# Patient Record
Sex: Female | Born: 1940 | Race: White | Hispanic: No | Marital: Single | Smoking: Never smoker
Health system: Southern US, Community
[De-identification: ages and names within clinical notes are randomized; demographics above are authoritative.]

## PROBLEM LIST (undated history)

## (undated) DIAGNOSIS — I10 Essential (primary) hypertension: Secondary | ICD-10-CM

## (undated) DIAGNOSIS — Z9289 Personal history of other medical treatment: Secondary | ICD-10-CM

## (undated) DIAGNOSIS — D649 Anemia, unspecified: Secondary | ICD-10-CM

## (undated) DIAGNOSIS — Z8719 Personal history of other diseases of the digestive system: Secondary | ICD-10-CM

## (undated) DIAGNOSIS — R479 Unspecified speech disturbances: Secondary | ICD-10-CM

## (undated) DIAGNOSIS — H269 Unspecified cataract: Secondary | ICD-10-CM

## (undated) DIAGNOSIS — R1310 Dysphagia, unspecified: Secondary | ICD-10-CM

## (undated) DIAGNOSIS — M7050 Other bursitis of knee, unspecified knee: Secondary | ICD-10-CM

## (undated) DIAGNOSIS — T4145XA Adverse effect of unspecified anesthetic, initial encounter: Secondary | ICD-10-CM

## (undated) DIAGNOSIS — T8859XA Other complications of anesthesia, initial encounter: Secondary | ICD-10-CM

## (undated) HISTORY — PX: APPENDECTOMY: SHX54

## (undated) HISTORY — PX: CATARACT EXTRACTION: SUR2

## (undated) HISTORY — PX: TONSILLECTOMY: SUR1361

---

## 1998-08-14 ENCOUNTER — Other Ambulatory Visit: Admission: RE | Admit: 1998-08-14 | Discharge: 1998-08-14 | Payer: Self-pay | Admitting: Endocrinology

## 1998-10-16 ENCOUNTER — Ambulatory Visit (HOSPITAL_COMMUNITY): Admission: RE | Admit: 1998-10-16 | Discharge: 1998-10-16 | Payer: Self-pay | Admitting: Family Medicine

## 1998-10-16 ENCOUNTER — Encounter: Payer: Self-pay | Admitting: Family Medicine

## 2000-07-28 ENCOUNTER — Ambulatory Visit (HOSPITAL_COMMUNITY): Admission: RE | Admit: 2000-07-28 | Discharge: 2000-07-28 | Payer: Self-pay | Admitting: Gastroenterology

## 2001-06-08 ENCOUNTER — Other Ambulatory Visit: Admission: RE | Admit: 2001-06-08 | Discharge: 2001-06-08 | Payer: Self-pay | Admitting: Family Medicine

## 2001-07-05 ENCOUNTER — Encounter: Payer: Self-pay | Admitting: Family Medicine

## 2001-07-05 ENCOUNTER — Encounter: Admission: RE | Admit: 2001-07-05 | Discharge: 2001-07-05 | Payer: Self-pay | Admitting: Family Medicine

## 2002-07-26 ENCOUNTER — Encounter: Admission: RE | Admit: 2002-07-26 | Discharge: 2002-07-26 | Payer: Self-pay | Admitting: Family Medicine

## 2002-07-26 ENCOUNTER — Encounter: Payer: Self-pay | Admitting: Family Medicine

## 2003-07-29 ENCOUNTER — Encounter: Admission: RE | Admit: 2003-07-29 | Discharge: 2003-07-29 | Payer: Self-pay | Admitting: Family Medicine

## 2004-10-02 ENCOUNTER — Encounter: Admission: RE | Admit: 2004-10-02 | Discharge: 2004-10-02 | Payer: Self-pay | Admitting: Family Medicine

## 2005-11-09 ENCOUNTER — Encounter: Admission: RE | Admit: 2005-11-09 | Discharge: 2005-11-09 | Payer: Self-pay | Admitting: Family Medicine

## 2006-07-30 ENCOUNTER — Emergency Department (HOSPITAL_COMMUNITY): Admission: EM | Admit: 2006-07-30 | Discharge: 2006-07-30 | Payer: Self-pay | Admitting: Family Medicine

## 2006-12-07 ENCOUNTER — Encounter: Admission: RE | Admit: 2006-12-07 | Discharge: 2006-12-07 | Payer: Self-pay | Admitting: Family Medicine

## 2007-12-12 ENCOUNTER — Encounter: Admission: RE | Admit: 2007-12-12 | Discharge: 2007-12-12 | Payer: Self-pay | Admitting: Family Medicine

## 2010-09-25 NOTE — Procedures (Signed)
[facility]  Patient:    Lauren [name], Lauren [name]                      MRN: [id number] Proc. Date: 07/28/00 Adm. Date:  [id number] Attending:  Louie Bun CC:         Hadassah Pais. Jeannetta Nap, M.D.   Procedure Report  PROCEDURE:  Esophagogastroduodenoscopy.  ENDOSCOPIST:  Everardo All. Madilyn Fireman, M.D.  INDICATIONS FOR PROCEDURE:  Several-month history of epigastric abdominal pain with failure to respond to antipeptic therapy and a normal abdominal ultrasound.  DESCRIPTION OF PROCEDURE:   The patient was placed in the left lateral decubitus position and placed on the pulse monitor with continuous low-flow oxygen delivered by nasal cannula.  She was sedated with 35 mg IV Demerol and 5 mg IV Versed.  The Olympus video endoscope was advanced under direct vision into the oropharynx and esophagus.  The esophagus was straight and of normal caliber with the squamocolumnar line at 38 cm.  There appeared to be about a 1 cm hiatal hernia distal to the squamocolumnar line.  The stomach was entered, and a small amount of liquid secretion was suctioned from the fundus. Retroflexed view of the cardia was unremarkable.  The fundus body, antrum, and pylorus all appeared normal.  The duodenum was entered and both bulb and second portion were well inspected and appeared to be within normal limits. The endoscope was then withdrawn back into the stomach and CLOtest obtained. The scope was then withdrawn, and the patient  returned to the recovery room in stable condition.  She tolerated the procedure well, and there were no immediate complications.  IMPRESSION: 1. Small hiatal hernia. 2. Otherwise essentially normal esophagogastroduodenoscopy.  PLAN:  Await CLOtest and consider treatment for eradication of Helicobacter if positive.  Will also obtain amylase and hepatic profile.  If all else unrevealing, will consider a trial of an antispasmodic agent. DD:  07/28/00 TD:  07/28/00 Job:  [id number]

## 2014-04-01 ENCOUNTER — Encounter (HOSPITAL_COMMUNITY): Payer: Self-pay | Admitting: Emergency Medicine

## 2014-04-01 ENCOUNTER — Observation Stay (HOSPITAL_COMMUNITY)
Admission: EM | Admit: 2014-04-01 | Discharge: 2014-04-03 | Disposition: A | Discharge status: Home / Self Care | Payer: Medicare Other | Attending: Internal Medicine | Admitting: Internal Medicine

## 2014-04-01 ENCOUNTER — Observation Stay (HOSPITAL_COMMUNITY): Payer: Medicare Other

## 2014-04-01 DIAGNOSIS — F70 Mild intellectual disabilities: Secondary | ICD-10-CM | POA: Insufficient documentation

## 2014-04-01 DIAGNOSIS — K449 Diaphragmatic hernia without obstruction or gangrene: Secondary | ICD-10-CM | POA: Insufficient documentation

## 2014-04-01 DIAGNOSIS — I10 Essential (primary) hypertension: Secondary | ICD-10-CM | POA: Diagnosis present

## 2014-04-01 DIAGNOSIS — F809 Developmental disorder of speech and language, unspecified: Secondary | ICD-10-CM | POA: Diagnosis not present

## 2014-04-01 DIAGNOSIS — K8020 Calculus of gallbladder without cholecystitis without obstruction: Secondary | ICD-10-CM | POA: Insufficient documentation

## 2014-04-01 DIAGNOSIS — D509 Iron deficiency anemia, unspecified: Principal | ICD-10-CM

## 2014-04-01 DIAGNOSIS — E538 Deficiency of other specified B group vitamins: Secondary | ICD-10-CM | POA: Clinically undetermined

## 2014-04-01 DIAGNOSIS — R109 Unspecified abdominal pain: Secondary | ICD-10-CM | POA: Insufficient documentation

## 2014-04-01 DIAGNOSIS — R531 Weakness: Secondary | ICD-10-CM | POA: Insufficient documentation

## 2014-04-01 DIAGNOSIS — R42 Dizziness and giddiness: Secondary | ICD-10-CM | POA: Diagnosis not present

## 2014-04-01 DIAGNOSIS — E785 Hyperlipidemia, unspecified: Secondary | ICD-10-CM | POA: Diagnosis present

## 2014-04-01 DIAGNOSIS — Z7982 Long term (current) use of aspirin: Secondary | ICD-10-CM | POA: Insufficient documentation

## 2014-04-01 DIAGNOSIS — D649 Anemia, unspecified: Secondary | ICD-10-CM

## 2014-04-01 DIAGNOSIS — R1084 Generalized abdominal pain: Secondary | ICD-10-CM | POA: Insufficient documentation

## 2014-04-01 DIAGNOSIS — R0600 Dyspnea, unspecified: Secondary | ICD-10-CM | POA: Insufficient documentation

## 2014-04-01 HISTORY — DX: Essential (primary) hypertension: I10

## 2014-04-01 HISTORY — DX: Anemia, unspecified: D64.9

## 2014-04-01 LAB — CBC
HEMATOCRIT: 22.9 % — AB (ref 36.0–46.0)
Hemoglobin: 5.9 g/dL — CL (ref 12.0–15.0)
MCH: 16.6 pg — AB (ref 26.0–34.0)
MCHC: 25.8 g/dL — ABNORMAL LOW (ref 30.0–36.0)
MCV: 64.3 fL — AB (ref 78.0–100.0)
PLATELETS: 478 10*3/uL — AB (ref 150–400)
RBC: 3.56 MIL/uL — AB (ref 3.87–5.11)
RDW: 17.4 % — ABNORMAL HIGH (ref 11.5–15.5)
WBC: 5.4 10*3/uL (ref 4.0–10.5)

## 2014-04-01 LAB — RETICULOCYTES
RBC.: 3.24 MIL/uL — ABNORMAL LOW (ref 3.87–5.11)
Retic Count, Absolute: 55.1 10*3/uL (ref 19.0–186.0)
Retic Ct Pct: 1.7 % (ref 0.4–3.1)

## 2014-04-01 LAB — COMPREHENSIVE METABOLIC PANEL
ALT: 6 U/L (ref 0–35)
ANION GAP: 14 (ref 5–15)
AST: 14 U/L (ref 0–37)
Albumin: 3.7 g/dL (ref 3.5–5.2)
Alkaline Phosphatase: 66 U/L (ref 39–117)
BUN: 14 mg/dL (ref 6–23)
CALCIUM: 9.5 mg/dL (ref 8.4–10.5)
CO2: 25 meq/L (ref 19–32)
CREATININE: 0.57 mg/dL (ref 0.50–1.10)
Chloride: 98 mEq/L (ref 96–112)
GLUCOSE: 120 mg/dL — AB (ref 70–99)
Potassium: 4 mEq/L (ref 3.7–5.3)
Sodium: 137 mEq/L (ref 137–147)
Total Bilirubin: 0.9 mg/dL (ref 0.3–1.2)
Total Protein: 7.4 g/dL (ref 6.0–8.3)

## 2014-04-01 LAB — TSH: TSH: 1.2 u[IU]/mL (ref 0.350–4.500)

## 2014-04-01 LAB — PREPARE RBC (CROSSMATCH)

## 2014-04-01 LAB — PROTIME-INR
INR: 1.17 (ref 0.00–1.49)
PROTHROMBIN TIME: 15.1 s (ref 11.6–15.2)

## 2014-04-01 LAB — ABO/RH: ABO/RH(D): A POS

## 2014-04-01 LAB — PRO B NATRIURETIC PEPTIDE: PRO B NATRI PEPTIDE: 803.4 pg/mL — AB (ref 0–125)

## 2014-04-01 MED ORDER — PANTOPRAZOLE SODIUM 40 MG PO TBEC
40.0000 mg | DELAYED_RELEASE_TABLET | Freq: Every day | ORAL | Status: DC
  Administered 2014-04-01 – 2014-04-03 (×3): 40 mg via ORAL
  Filled 2014-04-01 (×3): qty 1

## 2014-04-01 MED ORDER — ACETAMINOPHEN 500 MG PO TABS: 500.0000 mg | ORAL_TABLET | Freq: Four times a day (QID) | ORAL | Status: DC | PRN

## 2014-04-01 MED ORDER — PRAVASTATIN SODIUM 20 MG PO TABS
20.0000 mg | ORAL_TABLET | Freq: Every day | ORAL | Status: DC
  Administered 2014-04-02: 20 mg via ORAL
  Filled 2014-04-01 (×2): qty 1

## 2014-04-01 MED ORDER — SODIUM CHLORIDE 0.9 % IV SOLN: 10.0000 mL/h | Freq: Once | INTRAVENOUS | Status: AC

## 2014-04-01 MED ORDER — VITAMIN D (ERGOCALCIFEROL) 1.25 MG (50000 UNIT) PO CAPS
50000.0000 [IU] | ORAL_CAPSULE | ORAL | Status: DC
  Administered 2014-04-02: 50000 [IU] via ORAL
  Filled 2014-04-01 (×2): qty 1

## 2014-04-01 MED ORDER — SODIUM CHLORIDE 0.9 % IV SOLN
Freq: Once | INTRAVENOUS | Status: AC
  Administered 2014-04-01: 23:00:00 via INTRAVENOUS

## 2014-04-01 MED ORDER — INFLUENZA VAC SPLIT QUAD 0.5 ML IM SUSY
0.5000 mL | PREFILLED_SYRINGE | INTRAMUSCULAR | Status: AC
  Administered 2014-04-02: 0.5 mL via INTRAMUSCULAR
  Filled 2014-04-01 (×2): qty 0.5

## 2014-04-01 MED ORDER — IOHEXOL 300 MG/ML  SOLN
100.0000 mL | Freq: Once | INTRAMUSCULAR | Status: AC | PRN
  Administered 2014-04-01: 100 mL via INTRAVENOUS

## 2014-04-01 MED ORDER — ALENDRONATE SODIUM 35 MG PO TABS: 35.0000 mg | ORAL_TABLET | ORAL | Status: DC

## 2014-04-01 NOTE — ED Notes (Addendum)
Pt states no problem. She went to doctors office and had blood work done. Doctor told her to come here to get blood transfused d/t low H&H. Pt pale, skin yellowish tint, weak. Vitals WNL, pt assisted to restroom at this time.

## 2014-04-01 NOTE — ED Provider Notes (Signed)
Medical screening examination/treatment/procedure(s) were conducted as a shared visit with non-physician practitioner(s) and myself.  I personally evaluated the patient during the encounter.   EKG Interpretation None     Patient here from physician's office with hemoglobin of 5.7. Denies any history of blood loss. Will order blood transfusion and admitted to the medicine service  Toy BakerAnthony T Denya Buckingham, MD 04/01/14 1900

## 2014-04-01 NOTE — Plan of Care (Signed)
Problem: Phase I Progression Outcomes Goal: Voiding-avoid urinary catheter unless indicated Outcome: Completed/Met Date Met:  04/01/14

## 2014-04-01 NOTE — H&P (Signed)
Hospitalist Admission History and Physical  Patient name: Lauren [name] Medical record number: [id number] Date of birth: 1940/11/23 Age: 73 y.o. Gender: female  Primary Care Provider: No primary care provider on file.  Chief Complaint: anemia  History of Present Illness:This is a 73 y.o. year old female with significant past medical history of mild MR, HTN  presenting with anemia. 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 Friend states that pt was hospitalized for similar sxs earlier [facility]. Had an extensive workup per report with working dx fo ? b12 deficiency. Pt has been getting b12 shots from her PCP. Friend noticed pallor earlier today. [facility] who directed pt to ER for further evaluation. Pt denies any CP, nausea. Has had some exertional dyspnea per report. Denies any black/tarry stools/GIB. Has had hx/o ? Stomach ulcers in the past. Not aware of most recent EGD. Not on anticoabulation.  Presented to ER hemodynamically stable. Afebrile. Hgb 5.9. MCV 64. Cr 0.57. Hemoccult pending. No purpura/petechiae   Pt does have fair amount of abd TTP on exam.   Assessment and Plan: Lauren [name] is a 73 y.o. year old female presenting with anemia    Active Problems:   Anemia   1- Anemia  -microcytic distribution on MCV  -recurrent issue  -transfuse PRBCs  -hemoccult pending  -follow Hand H -check anemia panel, TSH  - check CT abd and pelvis given abd pain (mild to moderate) -consider H-O consult as clinically indicated   2- HTN  -BP stable  -cont home regimen  -follow BPs w/ transfusion   3- Mild MR  -mild speech/development delay at baseline per associate -pt currently at baseline  -follow    FEN/GI: heart healthy diet. PPI Prophylaxis: SCDs  Disposition: pending further evaluation  Code Status:Full Code    Patient Active Problem List    Diagnosis Date Noted  . Anemia 04/01/2014   Past Medical History: Past Medical History  Diagnosis Date  . Anemia   . Hypertension     Past Surgical History: History reviewed. No pertinent past surgical history.  Social History: History   Social History  . Marital Status: Single    Spouse Name: N/A    Number of Children: N/A  . Years of Education: N/A   Social History Main Topics  . Smoking status: Never Smoker   . Smokeless tobacco: None  . Alcohol Use: No  . Drug Use: No  . Sexual Activity: None   Other Topics Concern  . None   Social History Narrative  . None    Family History: History reviewed. No pertinent family history.  Allergies: No Known Allergies  Current Facility-Administered Medications  Medication Dose Route Frequency Provider Last Rate Last Dose  . 0.9 %  sodium chloride infusion  10 mL/hr Intravenous Once Christopher W Lawyer, PA-C      . 0.9 %  sodium chloride infusion   Intravenous Once Doree AlbeeSteven Jamahl Lemmons, MD       Current Outpatient Prescriptions  Medication Sig Dispense Refill  . acetaminophen (TYLENOL) 500 MG tablet Take 500 mg by mouth every 6 (six) hours as needed for moderate pain.    Marland Kitchen. alendronate (FOSAMAX) 35 MG tablet Take 35 mg by mouth every 7 (seven) days. On Wednesday    . aspirin 81 MG tablet Take 81 mg by mouth daily.    Marland Kitchen. atenolol (TENORMIN) 25 MG tablet  Take 25 mg by mouth daily.    . clonazePAM (KLONOPIN) 0.5 MG tablet Take 0.25-0.5 mg by mouth at bedtime as needed for anxiety.    . ergocalciferol (VITAMIN D2) 50000 UNITS capsule Take 50,000 Units by mouth every 14 (fourteen) days.    Marland Kitchen. lovastatin (MEVACOR) 20 MG tablet Take 20 mg by mouth daily with supper.    . ranitidine (ZANTAC) 150 MG capsule Take 150 mg by mouth every evening.    . cyanocobalamin (,VITAMIN B-12,) 1000 MCG/ML injection Inject 1,000 mcg into the muscle every 30 (thirty) days.     Review Of Systems: 12 point ROS negative except as noted above in  HPI.  Physical Exam: Filed Vitals:   04/01/14 1830  BP: 143/45  Pulse: 73  Temp:   Resp: 23    General: cooperative HEENT: PERRLA and extra ocular movement intact Heart: S1, S2 normal, no murmur, rub or gallop, regular rate and rhythm Lungs: clear to auscultation, no wheezes or rales and unlabored breathing Abdomen: + bowel sounds, mild abd TTP diffusely  Extremities: 2+ peripheral pulses, trace edema  Skin:no rashes, + diffuse pallor  Neurology: normal without focal findings  Labs and Imaging: Lab Results  Component Value Date/Time   NA 137 04/01/2014 06:05 PM   K 4.0 04/01/2014 06:05 PM   CL 98 04/01/2014 06:05 PM   CO2 25 04/01/2014 06:05 PM   BUN 14 04/01/2014 06:05 PM   CREATININE 0.57 04/01/2014 06:05 PM   GLUCOSE 120* 04/01/2014 06:05 PM   Lab Results  Component Value Date   WBC 5.4 04/01/2014   HGB 5.9* 04/01/2014   HCT 22.9* 04/01/2014   MCV 64.3* 04/01/2014   PLT 478* 04/01/2014    No results found.         Doree AlbeeSteven Oliana Gowens MD  Pager: [phone]

## 2014-04-01 NOTE — ED Notes (Signed)
Pt sent by PCP for Hgb 5.7. Hx of the same in May 2015, thought it was related to B12 deficiency. Pt has been getting B12 injections but problem happened again today. Denies rectal bleeding, colonoscopy negative during last episode of anemia. Pt states she feel normal. Pt pale.

## 2014-04-02 DIAGNOSIS — I10 Essential (primary) hypertension: Secondary | ICD-10-CM

## 2014-04-02 DIAGNOSIS — D509 Iron deficiency anemia, unspecified: Secondary | ICD-10-CM | POA: Diagnosis not present

## 2014-04-02 DIAGNOSIS — E538 Deficiency of other specified B group vitamins: Secondary | ICD-10-CM | POA: Clinically undetermined

## 2014-04-02 DIAGNOSIS — E785 Hyperlipidemia, unspecified: Secondary | ICD-10-CM | POA: Diagnosis present

## 2014-04-02 LAB — CBC WITH DIFFERENTIAL/PLATELET
BASOS PCT: 0 % (ref 0–1)
Basophils Absolute: 0 10*3/uL (ref 0.0–0.1)
EOS PCT: 0 % (ref 0–5)
Eosinophils Absolute: 0 10*3/uL (ref 0.0–0.7)
HEMATOCRIT: 32.4 % — AB (ref 36.0–46.0)
HEMOGLOBIN: 9.9 g/dL — AB (ref 12.0–15.0)
LYMPHS ABS: 0.5 10*3/uL — AB (ref 0.7–4.0)
LYMPHS PCT: 11 % — AB (ref 12–46)
MCH: 21.9 pg — ABNORMAL LOW (ref 26.0–34.0)
MCHC: 30.6 g/dL (ref 30.0–36.0)
MCV: 71.7 fL — ABNORMAL LOW (ref 78.0–100.0)
MONOS PCT: 17 % — AB (ref 3–12)
Monocytes Absolute: 0.8 10*3/uL (ref 0.1–1.0)
NEUTROS ABS: 3.3 10*3/uL (ref 1.7–7.7)
Neutrophils Relative %: 72 % (ref 43–77)
Platelets: 365 10*3/uL (ref 150–400)
RBC: 4.52 MIL/uL (ref 3.87–5.11)
RDW: 22 % — ABNORMAL HIGH (ref 11.5–15.5)
WBC: 4.6 10*3/uL (ref 4.0–10.5)

## 2014-04-02 LAB — COMPREHENSIVE METABOLIC PANEL
ALBUMIN: 3.1 g/dL — AB (ref 3.5–5.2)
ALK PHOS: 60 U/L (ref 39–117)
ALT: 6 U/L (ref 0–35)
ANION GAP: 17 — AB (ref 5–15)
AST: 14 U/L (ref 0–37)
BUN: 8 mg/dL (ref 6–23)
CALCIUM: 8.9 mg/dL (ref 8.4–10.5)
CO2: 21 mEq/L (ref 19–32)
Chloride: 100 mEq/L (ref 96–112)
Creatinine, Ser: 0.59 mg/dL (ref 0.50–1.10)
GFR calc non Af Amer: >90 mL/min (ref >90)
Glucose, Bld: 131 mg/dL — ABNORMAL HIGH (ref 70–99)
POTASSIUM: 3.5 meq/L — AB (ref 3.7–5.3)
SODIUM: 138 meq/L (ref 137–147)
TOTAL PROTEIN: 6.3 g/dL (ref 6.0–8.3)
Total Bilirubin: 1.9 mg/dL — ABNORMAL HIGH (ref 0.3–1.2)

## 2014-04-02 LAB — IRON AND TIBC
IRON: 10 ug/dL — AB (ref 42–135)
Saturation Ratios: 3 % — ABNORMAL LOW (ref 20–55)
TIBC: 376 ug/dL (ref 250–470)
UIBC: 366 ug/dL (ref 125–400)

## 2014-04-02 LAB — VITAMIN B12: VITAMIN B 12: 401 pg/mL (ref 211–911)

## 2014-04-02 MED ORDER — ATENOLOL 25 MG PO TABS
25.0000 mg | ORAL_TABLET | Freq: Every day | ORAL | Status: DC
  Administered 2014-04-02 – 2014-04-03 (×2): 25 mg via ORAL
  Filled 2014-04-02 (×3): qty 1

## 2014-04-02 MED ORDER — CLONAZEPAM 0.5 MG PO TABS: 0.2500 mg | ORAL_TABLET | Freq: Every evening | ORAL | Status: DC | PRN

## 2014-04-02 MED ORDER — FAMOTIDINE 20 MG PO TABS
20.0000 mg | ORAL_TABLET | Freq: Every day | ORAL | Status: DC
  Administered 2014-04-02: 20 mg via ORAL
  Filled 2014-04-02 (×2): qty 1

## 2014-04-02 NOTE — Progress Notes (Signed)
UR completed 

## 2014-04-02 NOTE — Progress Notes (Signed)
[facility] PROGRESS NOTE  Lauren RocherMyrtle [name] ZOX:[id number] DOB: [birth year] DOA: 04/01/2014 PCP: Kaleen MaskELKINS,WILSON OLIVER, MD   Brief narrative 73 y.o. year old female with significant past medical history of mild MR, HTN sent from PCP office with anemia. 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 Friend states that pt was hospitalized for [facility] sxs [facility]. Had an extensive workup per report with working dx fo ? b12 deficiency. Pt has been getting b12 shots from her PCP. Friend noticed that patient was found to be dyspnic and took her to PCP office. .Denies any black/tarry stools/GIB. Denies use of NSAIDs. Patient  hemodynamically stable in ED . Afebrile. Hgb 5.9. MCV 64. Cr 0.57.  Admitted to medial floor for transfusion and  w/up   Assessment/Plan: Symptomatic anemia As confirmed from PCP office, Hb previously of 5.5 in April and was sent to [facility] where she was transfused 3 u PRBC ( d/c summary reported anemia of unknown origin). Prior to that her hb was 11.9 in 02/2013. She has not been seen in the office after that and no blood work was done until yesterday ( ws 5.7 yesterday) Colonoscopy done in 2010 by Dr Matthias HughsBuccini ( no results in the system). EGD done in 2012 by Dr Madilyn FiremanHayes showed small hiatal hernia.  hb improved to 9.9 with 2 U PRBC. Denies use of NSAIDs. Denies bleeding per rectum or hematemesis. Low MCV noted. -TSH normal. check iron panel ( will reorder as sent on admission still not resulted), stool for occult blood,  B12 and folate. Will ask GI for evaluation.   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 Lives alone. PT  eval   Consultants:  [facility]  Procedures:  none  Antibiotics:  none  HPI/Subjective: Seen and examined. Dyspnea and fatigue improved with transfusion  Objective: Filed Vitals:   04/02/14 0845  BP: 135/55  Pulse: 75  Temp: 98.3 F (36.8 C)  Resp: 18    Intake/Output Summary (Last 24 hours) at 04/02/14 1446 Last data filed at 04/02/14 1300  Gross per 24 hour  Intake   2262 ml  Output    500 ml  Net   1762 ml   Filed Weights   04/01/14 2150 04/02/14 0500  Weight: 46.6 kg (102 lb 11.8 oz) 46.4 kg (102 lb 4.7 oz)    Exam:   General:  Elderly female in NAD  HEENT: pallor+, Moist mucosa,   Cardiovascular: normal S1&S2, no murmurs  Respiratory: clear b/l  Abdomen: soft, NT, ND, BS+  Ext: warm, no edema  CNS: alert and oriented  Data Reviewed: Basic Metabolic Panel:  Recent Labs Lab 04/01/14 1805 04/02/14 1040  NA 137 138  K 4.0 3.5*  CL 98 100  CO2 25 21  GLUCOSE 120* 131*  BUN 14 8  CREATININE 0.57 0.59  CALCIUM 9.5 8.9   Liver Function Tests:  Recent Labs Lab 04/01/14 1805 04/02/14 1040  AST 14 14  ALT 6 6  ALKPHOS 66 60  BILITOT 0.9 1.9*  PROT 7.4 6.3  ALBUMIN 3.7 3.1*   No results for input(s): LIPASE, AMYLASE in the last 168 hours. No results for input(s): AMMONIA  in the last 168 hours. CBC:  Recent Labs Lab 04/01/14 1805 04/02/14 1040  WBC 5.4 4.6  NEUTROABS  --  3.3  HGB 5.9* 9.9*  HCT 22.9* 32.4*  MCV 64.3* 71.7*  PLT 478* 365   Cardiac Enzymes: No results for input(s): CKTOTAL, CKMB, CKMBINDEX, TROPONINI in the last 168 hours. BNP (last 3 results)  Recent Labs  04/01/14 2030  PROBNP 803.4*   CBG: No results for input(s): GLUCAP in the last 168 hours.  No results found for this or any previous visit (from the past 240 hour(s)).   Studies: Ct Abdomen Pelvis W Contrast  04/01/2014   CLINICAL DATA:  Generalized abdominal pain. Lead loss. Anemia. Weakness.  EXAM: CT ABDOMEN AND PELVIS WITH CONTRAST   TECHNIQUE: Multidetector CT imaging of the abdomen and pelvis was performed using the standard protocol following bolus administration of intravenous contrast.  CONTRAST:  100mL OMNIPAQUE IOHEXOL 300 MG/ML  SOLN  COMPARISON:  None.  FINDINGS: There is a large hiatal hernia. Heart size is normal. Lung bases are clear. There stones in the gallbladder but the gallbladder is not distended. No dilated bile ducts. Liver parenchyma, spleen, pancreas, adrenal glands, and kidneys are normal except for 4 mm cyst in the upper pole of the left kidney.  The uterus and ovaries and bladder are normal. There are few diverticula in the sigmoid colon. No free air or free fluid. No acute osseous abnormality. Multilevel degenerative disc disease in the lumbar spine. Old compression deformity of L1.  IMPRESSION: No acute abnormality. Diverticulosis of the distal colon. Large hiatal hernia. Cholelithiasis.   Electronically Signed   By: Geanie CooleyJim  Maxwell M.D.   On: 04/01/2014 21:53    Scheduled Meds: . pantoprazole  40 mg Oral Daily  . pravastatin  20 mg Oral q1800  . Vitamin D (Ergocalciferol)  50,000 Units Oral Q14 Days   Continuous Infusions:      Time spent: 25 MINUTES    [name]  [facility] Pager [phone]. If 7PM-7AM, please contact night-coverage at [email or web address], password [email or web address] 04/02/2014, 2:46 PM  LOS: 1 day

## 2014-04-03 DIAGNOSIS — D509 Iron deficiency anemia, unspecified: Secondary | ICD-10-CM | POA: Diagnosis not present

## 2014-04-03 DIAGNOSIS — R1084 Generalized abdominal pain: Secondary | ICD-10-CM

## 2014-04-03 DIAGNOSIS — R109 Unspecified abdominal pain: Secondary | ICD-10-CM | POA: Insufficient documentation

## 2014-04-03 LAB — CBC WITH DIFFERENTIAL/PLATELET
BASOS ABS: 0.1 10*3/uL (ref 0.0–0.1)
Basophils Relative: 1 % (ref 0–1)
Eosinophils Absolute: 0.2 10*3/uL (ref 0.0–0.7)
Eosinophils Relative: 4 % (ref 0–5)
HCT: 31.9 % — ABNORMAL LOW (ref 36.0–46.0)
Hemoglobin: 9.6 g/dL — ABNORMAL LOW (ref 12.0–15.0)
LYMPHS ABS: 1 10*3/uL (ref 0.7–4.0)
Lymphocytes Relative: 17 % (ref 12–46)
MCH: 21.8 pg — AB (ref 26.0–34.0)
MCHC: 30.1 g/dL (ref 30.0–36.0)
MCV: 72.5 fL — AB (ref 78.0–100.0)
Monocytes Absolute: 0.8 10*3/uL (ref 0.1–1.0)
Monocytes Relative: 13 % — ABNORMAL HIGH (ref 3–12)
Neutro Abs: 4 10*3/uL (ref 1.7–7.7)
Neutrophils Relative %: 65 % (ref 43–77)
Platelets: 365 10*3/uL (ref 150–400)
RBC: 4.4 MIL/uL (ref 3.87–5.11)
RDW: 22.2 % — AB (ref 11.5–15.5)
WBC: 6.1 10*3/uL (ref 4.0–10.5)

## 2014-04-03 LAB — TYPE AND SCREEN
ABO/RH(D): A POS
ANTIBODY SCREEN: NEGATIVE
UNIT DIVISION: 0
Unit division: 0
Unit division: 0

## 2014-04-03 LAB — COMPREHENSIVE METABOLIC PANEL
ALBUMIN: 2.9 g/dL — AB (ref 3.5–5.2)
ALT: 5 U/L (ref 0–35)
AST: 18 U/L (ref 0–37)
Alkaline Phosphatase: 54 U/L (ref 39–117)
Anion gap: 13 (ref 5–15)
BUN: 17 mg/dL (ref 6–23)
CO2: 24 mEq/L (ref 19–32)
Calcium: 8.7 mg/dL (ref 8.4–10.5)
Chloride: 104 mEq/L (ref 96–112)
Creatinine, Ser: 0.56 mg/dL (ref 0.50–1.10)
GFR calc Af Amer: >90 mL/min (ref >90)
GFR calc non Af Amer: >90 mL/min (ref >90)
Glucose, Bld: 114 mg/dL — ABNORMAL HIGH (ref 70–99)
POTASSIUM: 3.7 meq/L (ref 3.7–5.3)
Sodium: 141 mEq/L (ref 137–147)
Total Bilirubin: 1.4 mg/dL — ABNORMAL HIGH (ref 0.3–1.2)
Total Protein: 6 g/dL (ref 6.0–8.3)

## 2014-04-03 LAB — FOLATE RBC: RBC Folate: 898 ng/mL — ABNORMAL HIGH (ref >280)

## 2014-04-03 MED ORDER — CLONAZEPAM 0.5 MG PO TABS: 0.2500 mg | ORAL_TABLET | Freq: Every evening | ORAL | Status: AC | PRN

## 2014-04-03 NOTE — Discharge Summary (Signed)
Physician Discharge Summary  Lauren RocherMyrtle [name] EAV:[id number] DOB: [birth year] DOA: 04/01/2014  PCP: Kaleen MaskELKINS,WILSON OLIVER, MD  Admit date: 04/01/2014 Discharge date: 04/03/2014  Recommendations for Outpatient Follow-up:  1. Pt will need to follow up with PCP in 2-3 weeks post discharge 2. Please obtain BMP to evaluate electrolytes and kidney function 3. Please also check CBC to evaluate Hg and Hct levels 4. Pt made aware that she needs to call Dr. Donavan BurnetBuccini's office to schedule follow up appointment   Discharge Diagnoses:  Principal Problem:   Microcytic anemia Active Problems:   Anemia   Essential hypertension, benign   Hyperlipidemia   B12 deficiency  Discharge Condition: Stable  Diet recommendation: Heart healthy diet discussed in details   Brief narrative 73 y.o. year old female with significant past medical history of mild MR, HTN sent from PCP office with anemia. Pt's friend and close associate Harriett Sineancy provided majority of history in setting of pt w/ mild MR w/ speech/developmental delay. Friend states that pt was hospitalized for [facility] sxs earlier [facility]. Had an extensive workup per report with working dx fo ? b12 deficiency. Pt has been getting b12 shots from her PCP. Friend noticed that patient was found to be dyspnic and took her to PCP office. .Denies any black/tarry stools/GIB. Denies use of NSAIDs.  Assessment/Plan: Symptomatic anemia As confirmed from PCP office, Hb previously of 5.5 in April and was sent to [facility] where she was transfused 3 u PRBC ( d/c summary reported anemia of unknown origin). Prior to that her hb was 11.9 in 02/2013. She has not been seen in the office after that and no blood work was done until one day PTA (5.7) Colonoscopy done in 2010 by Dr Matthias HughsBuccini ( no results in the system). EGD done in 2012 by Dr Madilyn FiremanHayes showed small hiatal hernia. hb improved to 9.9 with 2 U PRBC. Denies use of NSAIDs. Denies bleeding per  rectum or hematemesis. Low MCV noted. -TSH normal.  - Hg stable this AM and pt wants to go home with outpatient follow up with GI   HTN BP stable home meds  Mild mental retardation stable at baseline  Weakness:  PT to continue at home    DVT prophylaxis: SCD  Diet: regular   Code Status: full  Family Communication: none at bedside Disposition Plan: home today   Consultants:  None  Procedures:  None   Antibiotics:  None   Discharge Exam: Filed Vitals:   04/03/14 0500  BP: 124/58  Pulse:   Temp: 98.9 F (37.2 C)  Resp: 20   Filed Vitals:   04/02/14 0845 04/02/14 1457 04/02/14 2103 04/03/14 0500  BP: 135/55 155/57 131/54 124/58  Pulse: 75 78 73   Temp: 98.3 F (36.8 C) 98.6 F (37 C) 99.4 F (37.4 C) 98.9 F (37.2 C)  TempSrc: Oral Oral Oral Oral  Resp: 18 16 18 20   Height:      Weight:    45.5 kg (100 lb 5 oz)  SpO2: 99% 99% 97% 95%    General: Pt is alert, follows commands appropriately, not in acute distress Cardiovascular: Regular rate and rhythm, no rubs, no gallops Respiratory: Clear to auscultation bilaterally, no wheezing, no crackles, no rhonchi Abdominal: Soft, non tender, non distended, bowel sounds +, no guarding Extremities: no edema, no cyanosis, pulses palpable bilaterally DP and PT Neuro: Grossly nonfocal  Discharge Instructions     Medication List    TAKE these medications  acetaminophen 500 MG tablet  Commonly known as:  TYLENOL  Take 500 mg by mouth every 6 (six) hours as needed for moderate pain.     alendronate 35 MG tablet  Commonly known as:  FOSAMAX  Take 35 mg by mouth every 7 (seven) days. On Wednesday     aspirin 81 MG tablet  Take 81 mg by mouth daily.     atenolol 25 MG tablet  Commonly known as:  TENORMIN  Take 25 mg by mouth daily.     clonazePAM 0.5 MG tablet  Commonly known as:  KLONOPIN  Take 0.5-1 tablets (0.25-0.5 mg total) by mouth at bedtime as needed for anxiety.      cyanocobalamin 1000 MCG/ML injection  Commonly known as:  (VITAMIN B-12)  Inject 1,000 mcg into the muscle every 30 (thirty) days.     ergocalciferol 50000 UNITS capsule  Commonly known as:  VITAMIN D2  Take 50,000 Units by mouth every 14 (fourteen) days.     lovastatin 20 MG tablet  Commonly known as:  MEVACOR  Take 20 mg by mouth daily with supper.     ranitidine 150 MG capsule  Commonly known as:  ZANTAC  Take 150 mg by mouth every evening.           Follow-up Information    Follow up with Kaleen MaskELKINS,WILSON OLIVER, MD.   Specialty:  [facility]   Contact information:   [address] [place] [address] [phone]       Follow up with Barrie FolkHAYES,JOHN C, MD.   Specialty:  Gastroenterology   Contact information:   [address] [place] [address] [phone]       Follow up with Debbora PrestoMAGICK-MYERS, ISKRA, MD.   Specialty:  Internal Medicine   Why:  As needed call my cell phone [phone]   Contact information:   [address] [place] [address] [phone]        The results of significant diagnostics from this hospitalization (including imaging, microbiology, ancillary and laboratory) are listed below for reference.     Microbiology: No results found for this or any previous visit (from the past 240 hour(s)).   Labs: Basic Metabolic Panel:  Recent Labs Lab 04/01/14 1805 04/02/14 1040 04/03/14 0505  NA 137 138 141  K 4.0 3.5* 3.7  CL 98 100 104  CO2 25 21 24   GLUCOSE 120* 131* 114*  BUN 14 8 17   CREATININE 0.57 0.59 0.56  CALCIUM 9.5 8.9 8.7   Liver Function Tests:  Recent Labs Lab 04/01/14 1805 04/02/14 1040 04/03/14 0505  AST 14 14 18   ALT 6 6 5   ALKPHOS 66 60 54  BILITOT 0.9 1.9* 1.4*  PROT 7.4 6.3 6.0  ALBUMIN 3.7 3.1* 2.9*   CBC:  Recent Labs Lab 04/01/14 1805 04/02/14 1040 04/03/14 0505  WBC 5.4 4.6 6.1  NEUTROABS  --  3.3 4.0  HGB 5.9* 9.9* 9.6*  HCT 22.9* 32.4* 31.9*  MCV  64.3* 71.7* 72.5*  PLT 478* 365 365    BNP (last 3 results)  Recent Labs  04/01/14 2030  PROBNP 803.4*   SIGNED: Time coordinating discharge: Over 30 minutes  Debbora PrestoMAGICK-MYERS, ISKRA, MD  [facility] 04/03/2014, 9:18 AM Pager [phone]  If 7PM-7AM, please contact night-coverage [email or web address] Password [email or web address]

## 2014-04-03 NOTE — Plan of Care (Signed)
Problem: Phase I Progression Outcomes Goal: Pain controlled with appropriate interventions Outcome: Completed/Met Date Met:  04/03/14 Goal: OOB as tolerated unless otherwise ordered Outcome: Completed/Met Date Met:  04/03/14  Problem: Phase II Progression Outcomes Goal: IV changed to normal saline lock Outcome: Completed/Met Date Met:  04/03/14

## 2014-04-03 NOTE — Evaluation (Signed)
Physical Therapy Evaluation Patient Details Name: Lauren RocherMyrtle Bonny MRN: [id number] DOB: [birth year] Today's Date: 04/03/2014   History of Present Illness  History of Present Illness:This is a 73 y.o. year old female with significant past medical history of mild MR, HTN  presenting with anemia.  Clinical Impression  **Pt ambulated 150' with RW independently, no LOB. She appears to be at baseline of modified independent with mobility. From PT standpoint she is ready to DC home, no follow up needed. *    Follow Up Recommendations No PT follow up    Equipment Recommendations  None recommended by PT    Recommendations for Other Services       Precautions / Restrictions Precautions Precautions: None Restrictions Weight Bearing Restrictions: No      Mobility  Bed Mobility                  Transfers Overall transfer level: Independent                  Ambulation/Gait Ambulation/Gait assistance: Modified independent (Device/Increase time) Ambulation Distance (Feet): 150 Feet Assistive device: Rolling walker (2 wheeled) Gait Pattern/deviations: WFL(Within Functional Limits)   Gait velocity interpretation: Below normal speed for age/gender    Stairs            Wheelchair Mobility    Modified Rankin (Stroke Patients Only)       Balance Overall balance assessment: Modified Independent                                           Pertinent Vitals/Pain Pain Assessment: No/denies pain    Home Living Family/patient expects to be discharged to:: Private residence Living Arrangements: Alone Available Help at Discharge: Family;Available PRN/intermittently Type of Home: Apartment         Home Equipment: Walker - 4 wheels      Prior Function Level of Independence: Independent with assistive device(s)         Comments: walks with rollator when outside, no AD in apt, cousin drives her to get groceries     Hand Dominance        Extremity/Trunk Assessment   Upper Extremity Assessment: Overall WFL for tasks assessed           Lower Extremity Assessment: Overall WFL for tasks assessed      Cervical / Trunk Assessment: Kyphotic  Communication   Communication: No difficulties  Cognition Arousal/Alertness: Awake/alert Behavior During Therapy: WFL for tasks assessed/performed Overall Cognitive Status: History of cognitive impairments - at baseline                      General Comments      Exercises        Assessment/Plan    PT Assessment Patent does not need any further PT services  PT Diagnosis     PT Problem List    PT Treatment Interventions     PT Goals (Current goals can be found in the Care Plan section) Acute Rehab PT Goals PT Goal Formulation: All assessment and education complete, DC therapy    Frequency     Barriers to discharge        Co-evaluation               End of Session Equipment Utilized During Treatment: Gait belt Activity Tolerance: Patient tolerated treatment well Patient left: in chair;with  call bell/phone within reach Nurse Communication: Mobility status    Functional Assessment Tool Used: clinical judgement Functional Limitation: Mobility: Walking and moving around Mobility: Walking and Moving Around Current Status [phone](G8978): 0 percent impaired, limited or restricted Mobility: Walking and Moving Around Goal Status [phone](G8979): 0 percent impaired, limited or restricted Mobility: Walking and Moving Around Discharge Status [phone](G8980): 0 percent impaired, limited or restricted    Time: 1051-1107 PT Time Calculation (min) (ACUTE ONLY): 16 min   Charges:   PT Evaluation $Initial PT Evaluation Tier I: 1 Procedure PT Treatments $Gait Training: 8-22 mins   PT G Codes:   Functional Assessment Tool Used: clinical judgement Functional Limitation: Mobility: Walking and moving around    LaughlinUhlenberg, Pilgrim's PrideJennifer Kistler 04/03/2014, 11:09 AM [phone]

## 2014-04-03 NOTE — Progress Notes (Deleted)
CARE MANAGEMENT NOTE 04/03/2014  Patient:  Baxter KailMATNABANA,VHUTSHILO DAVID   Account Number:  [id number]  Date Initiated:  04/02/2014  Documentation initiated by:  Ferdinand CavaSCHETTINO,Suriyah Vergara  Subjective/Objective Assessment:   73 yo female admitted with drug overdose, from home with mother and GF     Action/Plan:   discharge planning   Anticipated DC Date:  04/04/2014   Anticipated DC Plan:  HOME/SELF CARE  In-house referral  Clinical Social Worker      DC Associate Professorlanning Services  CM consult  PCP issues      Choice offered to / List presented to:  C-1 Patient           Status of service:  Completed, signed off Medicare Important Message given?   (If response is "NO", the following Medicare IM given date fields will be blank) Date Medicare IM given:   Medicare IM given by:   Date Additional Medicare IM given:   Additional Medicare IM given by:    Discharge Disposition:  HOME/SELF CARE  Per UR Regulation:    If discussed at Long Length of Stay Meetings, dates discussed:    Comments:  04/03/14 Ferdinand CavaAndrea Schettino RN BSN CM [phone] [id number] Provided patient with 5 page resource list for uninsured, also provided a [facility] pamphlet and encouraged patient to establish care with PCP.  04/02/14 Ferdinand CavaAndrea Schettino RN BSN CM [phone] no orders, no case management needs, will continue to follow

## 2014-04-03 NOTE — Discharge Instructions (Signed)
Anemia, Nonspecific Anemia is a condition in which the concentration of red blood cells or hemoglobin in the blood is below normal. Hemoglobin is a substance in red blood cells that carries oxygen to the tissues of the body. Anemia results in not enough oxygen reaching these tissues.  CAUSES  Common causes of anemia include:   Excessive bleeding. Bleeding may be internal or external. This includes excessive bleeding from periods (in women) or from the intestine.   Poor nutrition.   Chronic kidney, thyroid, and liver disease.  Bone marrow disorders that decrease red blood cell production.  Cancer and treatments for cancer.  HIV, AIDS, and their treatments.  Spleen problems that increase red blood cell destruction.  Blood disorders.  Excess destruction of red blood cells due to infection, medicines, and autoimmune disorders. SIGNS AND SYMPTOMS   Minor weakness.   Dizziness.   Headache.  Palpitations.   Shortness of breath, especially with exercise.   Paleness.  Cold sensitivity.  Indigestion.  Nausea.  Difficulty sleeping.  Difficulty concentrating. Symptoms may occur suddenly or they may develop slowly.  DIAGNOSIS  Additional blood tests are often needed. These help your health care provider determine the best treatment. Your health care provider will check your stool for blood and look for other causes of blood loss.  TREATMENT  Treatment varies depending on the cause of the anemia. Treatment can include:   Supplements of iron, vitamin B12, or folic acid.   Hormone medicines.   A blood transfusion. This may be needed if blood loss is severe.   Hospitalization. This may be needed if there is significant continual blood loss.   Dietary changes.  Spleen removal. HOME CARE INSTRUCTIONS Keep all follow-up appointments. It often takes many weeks to correct anemia, and having your health care provider check on your condition and your response to  treatment is very important. SEEK IMMEDIATE MEDICAL CARE IF:   You develop extreme weakness, shortness of breath, or chest pain.   You become dizzy or have trouble concentrating.  You develop heavy vaginal bleeding.   You develop a rash.   You have bloody or black, tarry stools.   You faint.   You vomit up blood.   You vomit repeatedly.   You have abdominal pain.  You have a fever or persistent symptoms for more than 2-3 days.   You have a fever and your symptoms suddenly get worse.   You are dehydrated.  MAKE SURE YOU:  Understand these instructions.  Will watch your condition.  Will get help right away if you are not doing well or get worse. Document Released: 06/03/2004 Document Revised: 12/27/2012 Document Reviewed: 10/20/2012 ExitCare Patient Information 2015 [employer]. This information is not intended to replace advice given to you by your health care provider. Make sure you discuss any questions you have with your health care provider.  

## 2014-04-03 NOTE — Progress Notes (Signed)
Discharge instructions and medications reviewed with patient and Harriett Sineancy. Nancy verbalizes understanding and has no questions at this time. Patient confirms she has all personal belongings in her possession. Patient d/c home.

## 2014-04-09 LAB — FOLATE

## 2014-04-09 LAB — FERRITIN: Ferritin: 2 ng/mL — ABNORMAL LOW (ref 10–291)

## 2014-04-09 LAB — IRON AND TIBC
Iron: <10 ug/dL — ABNORMAL LOW (ref 42–135)
UIBC: 394 ug/dL (ref 125–400)

## 2014-04-09 LAB — VITAMIN B12: VITAMIN B 12: 396 pg/mL (ref 211–911)

## 2014-04-20 NOTE — ED Provider Notes (Signed)
CSN: [id number]     Arrival date & time 04/01/14  1724 History   First MD Initiated Contact with Patient 04/01/14 1821     Chief Complaint  Patient presents with  . Anemia     (Consider location/radiation/quality/duration/timing/severity/associated sxs/prior Treatment) HPI Patient presents to the emergency department with weakness and lightheadedness and a low hemoglobin.  Patient sent by her primary care doctor for hemoglobin of 5.7.  Patient states that she has had be admitted to the hospital for previous episodes of this in the past.  Patient states that she has had no chest pain, nausea, vomiting, abdominal pain, headache, blurred vision, rash, fever, cough, runny nose, sore throat, dysuria, lightheadedness, near syncope or syncope.  Patient states that she did not take any medications for her symptoms prior to arrival.  Patient states that activity seems to make her condition worse Past Medical History  Diagnosis Date  . Anemia   . Hypertension    History reviewed. No pertinent past surgical history. History reviewed. No pertinent family history. History  Substance Use Topics  . Smoking status: Never Smoker   . Smokeless tobacco: Not on file  . Alcohol Use: No   OB History    No data available     Review of Systems  All other systems negative except as documented in the HPI. All pertinent positives and negatives as reviewed in the HPI.   Allergies  Review of patient's allergies indicates no known allergies.  Home Medications   Prior to Admission medications   Medication Sig Start Date End Date Taking? Authorizing Provider  acetaminophen (TYLENOL) 500 MG tablet Take 500 mg by mouth every 6 (six) hours as needed for moderate pain.   Yes Historical Provider, MD  alendronate (FOSAMAX) 35 MG tablet Take 35 mg by mouth every 7 (seven) days. On Wednesday   Yes Historical Provider, MD  aspirin 81 MG tablet Take 81 mg by mouth daily.   Yes Historical Provider, MD  atenolol  (TENORMIN) 25 MG tablet Take 25 mg by mouth daily.   Yes Historical Provider, MD  ergocalciferol (VITAMIN D2) 50000 UNITS capsule Take 50,000 Units by mouth every 14 (fourteen) days.   Yes Historical Provider, MD  lovastatin (MEVACOR) 20 MG tablet Take 20 mg by mouth daily with supper.   Yes Historical Provider, MD  ranitidine (ZANTAC) 150 MG capsule Take 150 mg by mouth every evening.   Yes Historical Provider, MD  clonazePAM (KLONOPIN) 0.5 MG tablet Take 0.5-1 tablets (0.25-0.5 mg total) by mouth at bedtime as needed for anxiety. 04/03/14   Dorothea Ogle, MD  cyanocobalamin (,VITAMIN B-12,) 1000 MCG/ML injection Inject 1,000 mcg into the muscle every 30 (thirty) days.    Historical Provider, MD   BP 124/58 mmHg  Pulse 73  Temp(Src) 98.9 F (37.2 C) (Oral)  Resp 20  Ht 4' (1.219 m)  Wt 100 lb 5 oz (45.5 kg)  BMI 30.62 kg/m2  SpO2 95%  LMP  Physical Exam  Constitutional: She is oriented to person, place, and time. She appears well-developed and well-nourished. No distress.  HENT:  Head: Normocephalic and atraumatic.  Mouth/Throat: Oropharynx is clear and moist.  Eyes: Pupils are equal, round, and reactive to light.  Neck: Normal range of motion. Neck supple.  Cardiovascular: Normal rate, regular rhythm and normal heart sounds.  Exam reveals no gallop and no friction rub.   No murmur heard. Pulmonary/Chest: Effort normal and breath sounds normal. No respiratory distress.  Abdominal: Soft. Bowel sounds are  normal. She exhibits no distension. There is no tenderness.  Musculoskeletal: Normal range of motion. She exhibits no edema.  Neurological: She is alert and oriented to person, place, and time. She exhibits normal muscle tone. Coordination normal.  Skin: Skin is warm and dry. No rash noted. No erythema. There is pallor.  Patient does have a slight jaundiced appearance to her skin  Nursing note and vitals reviewed.   ED Course  Procedures (including critical care time) Labs  Review Labs Reviewed  CBC - Abnormal; Notable for the following:    RBC 3.56 (*)    Hemoglobin 5.9 (*)    HCT 22.9 (*)    MCV 64.3 (*)    MCH 16.6 (*)    MCHC 25.8 (*)    RDW 17.4 (*)    Platelets 478 (*)    All other components within normal limits  COMPREHENSIVE METABOLIC PANEL - Abnormal; Notable for the following:    Glucose, Bld 120 (*)    All other components within normal limits  COMPREHENSIVE METABOLIC PANEL - Abnormal; Notable for the following:    Potassium 3.5 (*)    Glucose, Bld 131 (*)    Albumin 3.1 (*)    Total Bilirubin 1.9 (*)    Anion gap 17 (*)    All other components within normal limits  RETICULOCYTES - Abnormal; Notable for the following:    RBC. 3.24 (*)    All other components within normal limits  CBC WITH DIFFERENTIAL - Abnormal; Notable for the following:    Hemoglobin 9.9 (*)    HCT 32.4 (*)    MCV 71.7 (*)    MCH 21.9 (*)    RDW 22.0 (*)    Lymphocytes Relative 11 (*)    Monocytes Relative 17 (*)    Lymphs Abs 0.5 (*)    All other components within normal limits  PRO B NATRIURETIC PEPTIDE - Abnormal; Notable for the following:    Pro B Natriuretic peptide (BNP) 803.4 (*)    All other components within normal limits  IRON AND TIBC - Abnormal; Notable for the following:    Iron <10 (*)    All other components within normal limits  FERRITIN - Abnormal; Notable for the following:    Ferritin 2 (*)    All other components within normal limits  IRON AND TIBC - Abnormal; Notable for the following:    Iron 10 (*)    Saturation Ratios 3 (*)    All other components within normal limits  FOLATE RBC - Abnormal; Notable for the following:    RBC Folate 898 (*)    All other components within normal limits  COMPREHENSIVE METABOLIC PANEL - Abnormal; Notable for the following:    Glucose, Bld 114 (*)    Albumin 2.9 (*)    Total Bilirubin 1.4 (*)    All other components within normal limits  CBC WITH DIFFERENTIAL - Abnormal; Notable for the  following:    Hemoglobin 9.6 (*)    HCT 31.9 (*)    MCV 72.5 (*)    MCH 21.8 (*)    RDW 22.2 (*)    Monocytes Relative 13 (*)    All other components within normal limits  TSH  PROTIME-INR  VITAMIN B12  FOLATE  VITAMIN B12  TYPE AND SCREEN  PREPARE RBC (CROSSMATCH)  PREPARE RBC (CROSSMATCH)  ABO/RH    Imaging Review No results found.   EKG Interpretation None      MDM   Final  diagnoses:  Symptomatic anemia    The patient be admitted to the hospital for further evaluation and care.  I spoke with the [facility] who will admit the patient  Carlyle DollyChristopher W Lanell Carpenter, PA-C 04/20/14 1524  Toy BakerAnthony T Allen, MD 04/20/14 [phone]

## 2014-07-23 ENCOUNTER — Other Ambulatory Visit: Payer: Self-pay | Admitting: Gastroenterology

## 2014-07-23 DIAGNOSIS — K449 Diaphragmatic hernia without obstruction or gangrene: Secondary | ICD-10-CM

## 2014-07-23 DIAGNOSIS — D509 Iron deficiency anemia, unspecified: Secondary | ICD-10-CM

## 2014-07-31 ENCOUNTER — Ambulatory Visit
Admission: RE | Admit: 2014-07-31 | Discharge: 2014-07-31 | Disposition: A | Discharge status: Home / Self Care | Payer: Medicare Other | Source: Ambulatory Visit | Attending: Gastroenterology | Admitting: Gastroenterology

## 2014-07-31 ENCOUNTER — Other Ambulatory Visit: Payer: Self-pay | Admitting: Gastroenterology

## 2014-07-31 DIAGNOSIS — K449 Diaphragmatic hernia without obstruction or gangrene: Secondary | ICD-10-CM

## 2014-07-31 DIAGNOSIS — D509 Iron deficiency anemia, unspecified: Secondary | ICD-10-CM

## 2015-02-19 ENCOUNTER — Ambulatory Visit: Payer: Self-pay | Admitting: Surgery

## 2015-02-19 NOTE — H&P (Signed)
Lauren RocherMyrtle [name]  [facility] Patient #: [id number] DOB: 1940-05-15 Single / Language: [place] / Race: White Female  History of Present Illness Lauren [name](Lauren [name] B. Lauren DeutscherMartin MD; 09/05/2014 11:07 AM) Patient words: CC. Hiatus hernia with anemia  HPI: This very pleasant 74 year old lady with some history of cognitive delay is accompanied by her cousin, Lauren [name] having been referred by Dr. Matthias [name] for severe iron deficiency anemia attributed to a large hiatal hernia found on upper endoscopy. Dr. Matthias [name] obtained an upper GI series and I reviewed that and this shows about a third of her stomach is above her diaphragm.  She has a history of gastroesophageal reflux. She does have a history of taking aspirin daily. This will endoscopy now was compared to 1 and 2002 at which time she had only a small hiatal hernia. She still has heartburn and takes Zantac to bedtime. She indicated to me that sometimes she does get choked on things particularly trying to eat rice.  I explained lap hiatus hernia repair including Nissen fundoplication with her in some detail. I gave her the booklet on this procedure. She would like to schedule this repair.  Informed consent obtained and orders placed in EPIC.        Past Surgical History  Appendectomy Colon Polyp Removal - Colonoscopy  Diagnostic Studies History Colonoscopy within last year Pap Smear 1-5 years ago  Allergies  No Known Drug Allergies04/28/2016  Medication History  Alendronate Sodium (35MG  Tablet, Oral) Active. Lovastatin (20MG  Tablet, Oral) Active. Atenolol (25MG  Tablet, Oral) Active. ClonazePAM (0.5MG  Tablet, Oral) Active. Ranitidine HCl (150MG  Tablet, Oral) Active. Medications Reconciled  Social History  Caffeine use Coffee, Tea. Tobacco use Former smoker.  Pregnancy / Birth History  Age of menopause <47 Para 0  Review of Systems  Skin Not Present- Change in Wart/Mole, Dryness, Hives, Jaundice, New  Lesions, Non-Healing Wounds, Rash and Ulcer. Cardiovascular Present- Swelling of Extremities. Not Present- Chest Pain, Difficulty Breathing Lying Down, Leg Cramps, Palpitations, Rapid Heart Rate and Shortness of Breath. Gastrointestinal Present- Indigestion. Not Present- Abdominal Pain, Bloating, Bloody Stool, Change in Bowel Habits, Chronic diarrhea, Constipation, Difficulty Swallowing, Excessive gas, Gets full quickly at meals, Hemorrhoids, Nausea, Rectal Pain and Vomiting. Musculoskeletal Not Present- Back Pain, Joint Pain, Joint Stiffness, Muscle Pain, Muscle Weakness and Swelling of Extremities. Neurological Not Present- Decreased Memory, Fainting, Headaches, Numbness, Seizures, Tingling, Tremor, Trouble walking and Weakness.   Vitals (Lauren [name] CMA;eight: 145 lb Height: 57in Body Surface Area: 1.63 m Body Mass Index: 31.38 kg/m Temp.: 97.61F(Temporal)  Pulse: 76 (Regular)  BP: 114/72 (Sitting, Left Arm, Standard)    Physical Exam  The physical exam findings are as follows: Note:HEENT-unremarkable NECK-no thyromegaly or bruits CHEST-clear to auscultation HEART-SR withou murmurs ABDOMEN-non tender, no prior surgery EXT-FROM NEURO-alert and oriented x 3; able to follow my discussion and respond appropriately. Facial tic occassionally    Assessment & Plan Lauren [name](Lauren [name] B. 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

## 2015-03-11 NOTE — Patient Instructions (Addendum)
20 Lauren [name]  03/11/2015   Your procedure is scheduled on:   03-19-2015 Wednesday  Enter through [facility]  Entrance and follow signs to [facility]. Arrive at    0630    AM ..  (Limit 1 person with you).  Call this number if you have problems the morning of surgery: [phone]  Or Presurgical Testing [phone].   For Living Will and/or Health Care Power Attorney Forms: please provide copy for your medical record,may bring AM of surgery(Forms should be already notarized -we do not provide this service).(03-12-15  No information preferred today).  Remember: Follow any bowel prep instructions per MD office.    Do not eat food/ or drink: After Midnight.      Take these medicines the morning of surgery with A SIP OF WATER-   (DO NOT TAKE ANY DIABETIC MEDS AM OF SURGERY) : Atenolol.   Do not wear jewelry, make-up or nail polish.  Do not wear deodorant, lotions, powders, or perfumes.   Do not shave legs and under arms- 48 hours(2 days) prior to first CHG shower.(Shaving face and neck okay.)  Do not bring valuables to the hospital.(Hospital is not responsible for lost valuables).  Contacts, dentures or removable bridgework, body piercing, hair pins may not be worn into surgery.  Leave suitcase in the car. After surgery it may be brought to your room.  For patients admitted to the hospital, checkout time is 11:00 AM the day of discharge.(Restricted visitors-Any Persons displaying flu-like symptoms or illness).    Patients discharged the day of surgery will not be allowed to drive home. Must have responsible person with you x 24 hours once discharged.  Name and phone number of your driver: Anice Paganini - [phone]     Please read over the following fact sheets that you were given:  CHG(Chlorhexidine Gluconate 4% Surgical Soap) use.           [facility] - Preparing for Surgery Before surgery, you can play an important role.  Because skin is not sterile,  your skin needs to be as free of germs as possible.  You can reduce the number of germs on your skin by washing with CHG (chlorahexidine gluconate) soap before surgery.  CHG is an antiseptic cleaner which kills germs and bonds with the skin to continue killing germs even after washing. Please DO NOT use if you have an allergy to CHG or antibacterial soaps.  If your skin becomes reddened/irritated stop using the CHG and inform your nurse when you arrive at Short Stay. Do not shave (including legs and underarms) for at least 48 hours prior to the first CHG shower.  You may shave your face/neck. Please follow these instructions carefully:  1.  Shower with CHG Soap the night before surgery and the  morning of Surgery.  2.  If you choose to wash your hair, wash your hair first as usual with your  normal  shampoo.  3.  After you shampoo, rinse your hair and body thoroughly to remove the  shampoo.                           4.  Use CHG as you would any other liquid soap.  You can apply chg directly  to the skin and wash                       Gently with a scrungie  or clean washcloth.  5.  Apply the CHG Soap to your body ONLY FROM THE NECK DOWN.   Do not use on face/ open                           Wound or open sores. Avoid contact with eyes, ears mouth and genitals (private parts).                       Wash face,  Genitals (private parts) with your normal soap.             6.  Wash thoroughly, paying special attention to the area where your surgery  will be performed.  7.  Thoroughly rinse your body with warm water from the neck down.  8.  DO NOT shower/wash with your normal soap after using and rinsing off  the CHG Soap.                9.  Pat yourself dry with a clean towel.            10.  Wear clean pajamas.            11.  Place clean sheets on your bed the night of your first shower and do not  sleep with pets. Day of Surgery : Do not apply any lotions/deodorants the morning of surgery.  Please wear  clean clothes to the hospital/surgery center.  FAILURE TO FOLLOW THESE INSTRUCTIONS MAY RESULT IN THE CANCELLATION OF YOUR SURGERY PATIENT SIGNATURE_________________________________  NURSE SIGNATURE__________________________________  ________________________________________________________________________

## 2015-03-12 ENCOUNTER — Encounter (HOSPITAL_COMMUNITY): Payer: Self-pay

## 2015-03-12 ENCOUNTER — Encounter (HOSPITAL_COMMUNITY)
Admission: RE | Admit: 2015-03-12 | Discharge: 2015-03-12 | Disposition: A | Discharge status: Home / Self Care | Payer: Medicare Other | Source: Ambulatory Visit | Attending: Surgery | Admitting: Surgery

## 2015-03-12 DIAGNOSIS — Z01818 Encounter for other preprocedural examination: Secondary | ICD-10-CM | POA: Insufficient documentation

## 2015-03-12 DIAGNOSIS — K449 Diaphragmatic hernia without obstruction or gangrene: Secondary | ICD-10-CM | POA: Diagnosis not present

## 2015-03-12 HISTORY — DX: Dysphagia, unspecified: R13.10

## 2015-03-12 HISTORY — DX: Unspecified speech disturbances: R47.9

## 2015-03-12 HISTORY — DX: Personal history of other medical treatment: Z92.89

## 2015-03-12 HISTORY — DX: Unspecified cataract: H26.9

## 2015-03-12 HISTORY — DX: Other complications of anesthesia, initial encounter: T88.59XA

## 2015-03-12 HISTORY — DX: Personal history of other diseases of the digestive system: Z87.19

## 2015-03-12 HISTORY — DX: Adverse effect of unspecified anesthetic, initial encounter: T41.45XA

## 2015-03-12 HISTORY — DX: Other bursitis of knee, unspecified knee: M70.50

## 2015-03-12 LAB — BASIC METABOLIC PANEL
ANION GAP: 6 (ref 5–15)
BUN: 17 mg/dL (ref 6–20)
CO2: 27 mmol/L (ref 22–32)
Calcium: 9.4 mg/dL (ref 8.9–10.3)
Chloride: 106 mmol/L (ref 101–111)
Creatinine, Ser: 0.61 mg/dL (ref 0.44–1.00)
GFR calc Af Amer: >60 mL/min (ref >60)
GFR calc non Af Amer: >60 mL/min (ref >60)
GLUCOSE: 106 mg/dL — AB (ref 65–99)
POTASSIUM: 5.3 mmol/L — AB (ref 3.5–5.1)
Sodium: 139 mmol/L (ref 135–145)

## 2015-03-12 LAB — CBC
HEMATOCRIT: 44.7 % (ref 36.0–46.0)
Hemoglobin: 14.9 g/dL (ref 12.0–15.0)
MCH: 32 pg (ref 26.0–34.0)
MCHC: 33.3 g/dL (ref 30.0–36.0)
MCV: 96.1 fL (ref 78.0–100.0)
Platelets: 208 10*3/uL (ref 150–400)
RBC: 4.65 MIL/uL (ref 3.87–5.11)
RDW: 12.7 % (ref 11.5–15.5)
WBC: 6.6 10*3/uL (ref 4.0–10.5)

## 2015-03-12 NOTE — Pre-Procedure Instructions (Signed)
03-12-15 EKG done.

## 2015-03-12 NOTE — Pre-Procedure Instructions (Deleted)
20 Shavonn Convey  03/12/2015   Your procedure is scheduled on:   03-19-2015 Wednesday  Enter through [facility]  Entrance and follow signs to [facility]. Arrive at  0630      AM.  (Limit 1 person with you).  Call this number if you have problems the morning of surgery: [phone]  Or Presurgical Testing [phone].   For Living Will and/or Health Care Power Attorney Forms: please provide copy for your medical record,may bring AM of surgery(Forms should be already notarized -we do not provide this service).( No information preferred today).  Remember: Follow any bowel prep instructions per MD office.    Do not eat food/ or drink: After Midnight.  Exception: may have clear liquids:up to 6 Hours before arrival. Nothing after:  Clear liquids include soda, tea, black coffee, apple or grape juice, broth.  Take these medicines the morning of surgery with A SIP OF WATER-   (DO NOT TAKE ANY DIABETIC MEDS AM OF SURGERY) :Atenolol.   Do not wear jewelry, make-up or nail polish.  Do not wear deodorant, lotions, powders, or perfumes.   Do not shave legs and under arms- 48 hours(2 days) prior to first CHG shower.(Shaving face and neck okay.)  Do not bring valuables to the hospital.(Hospital is not responsible for lost valuables).  Contacts, dentures or removable bridgework, body piercing, hair pins may not be worn into surgery.  Leave suitcase in the car. After surgery it may be brought to your room.  For patients admitted to the hospital, checkout time is 11:00 AM the day of discharge.(Restricted visitors-Any Persons displaying flu-like symptoms or illness).    Patients discharged the day of surgery will not be allowed to drive home. Must have responsible person with you x 24 hours once discharged.  Name and phone number of your driver:      Please read over the following fact sheets that you were given:  CHG(Chlorhexidine Gluconate 4% Surgical Soap) use, MRSA Information, Blood  Transfusion fact sheet, Incentive Spirometry Instruction.  Remember : Type/Screen "Blue armbands" - may not be removed once applied(would result in being retested AM of surgery, if removed).         [facility] - Preparing for Surgery Before surgery, you can play an important role.  Because skin is not sterile, your skin needs to be as free of germs as possible.  You can reduce the number of germs on your skin by washing with CHG (chlorahexidine gluconate) soap before surgery.  CHG is an antiseptic cleaner which kills germs and bonds with the skin to continue killing germs even after washing. Please DO NOT use if you have an allergy to CHG or antibacterial soaps.  If your skin becomes reddened/irritated stop using the CHG and inform your nurse when you arrive at Short Stay. Do not shave (including legs and underarms) for at least 48 hours prior to the first CHG shower.  You may shave your face/neck. Please follow these instructions carefully:  1.  Shower with CHG Soap the night before surgery and the  morning of Surgery.  2.  If you choose to wash your hair, wash your hair first as usual with your  normal  shampoo.  3.  After you shampoo, rinse your hair and body thoroughly to remove the  shampoo.                           4.  Use CHG  as you would any other liquid soap.  You can apply chg directly  to the skin and wash                       Gently with a scrungie or clean washcloth.  5.  Apply the CHG Soap to your body ONLY FROM THE NECK DOWN.   Do not use on face/ open                           Wound or open sores. Avoid contact with eyes, ears mouth and genitals (private parts).                       Wash face,  Genitals (private parts) with your normal soap.             6.  Wash thoroughly, paying special attention to the area where your surgery  will be performed.  7.  Thoroughly rinse your body with warm water from the neck down.  8.  DO NOT shower/wash with your normal soap after using and  rinsing off  the CHG Soap.                9.  Pat yourself dry with a clean towel.            10.  Wear clean pajamas.            11.  Place clean sheets on your bed the night of your first shower and do not  sleep with pets. Day of Surgery : Do not apply any lotions/deodorants the morning of surgery.  Please wear clean clothes to the hospital/surgery center.  FAILURE TO FOLLOW THESE INSTRUCTIONS MAY RESULT IN THE CANCELLATION OF YOUR SURGERY PATIENT SIGNATURE_________________________________  NURSE SIGNATURE__________________________________  ________________________________________________________________________

## 2015-03-12 NOTE — Pre-Procedure Instructions (Signed)
03-12-15 EKG done today.

## 2015-03-19 ENCOUNTER — Encounter (HOSPITAL_COMMUNITY): Payer: Self-pay | Admitting: *Deleted

## 2015-03-19 ENCOUNTER — Inpatient Hospital Stay (HOSPITAL_COMMUNITY): Payer: Medicare Other | Admitting: Certified Registered Nurse Anesthetist

## 2015-03-19 ENCOUNTER — Inpatient Hospital Stay (HOSPITAL_COMMUNITY)
Admission: RE | Admit: 2015-03-19 | Discharge: 2015-03-22 | DRG: 328 | Disposition: A | Discharge status: Home / Self Care | Payer: Medicare Other | Source: Ambulatory Visit | Attending: Surgery | Admitting: Surgery

## 2015-03-19 ENCOUNTER — Encounter (HOSPITAL_COMMUNITY)
Admission: RE | Disposition: A | Discharge status: Home / Self Care | Payer: Self-pay | Source: Ambulatory Visit | Attending: Surgery

## 2015-03-19 DIAGNOSIS — Z86010 Personal history of colonic polyps: Secondary | ICD-10-CM

## 2015-03-19 DIAGNOSIS — Z9889 Other specified postprocedural states: Secondary | ICD-10-CM

## 2015-03-19 DIAGNOSIS — K449 Diaphragmatic hernia without obstruction or gangrene: Secondary | ICD-10-CM | POA: Diagnosis present

## 2015-03-19 DIAGNOSIS — Z79899 Other long term (current) drug therapy: Secondary | ICD-10-CM

## 2015-03-19 DIAGNOSIS — Z87891 Personal history of nicotine dependence: Secondary | ICD-10-CM | POA: Diagnosis not present

## 2015-03-19 DIAGNOSIS — Z01812 Encounter for preprocedural laboratory examination: Secondary | ICD-10-CM | POA: Diagnosis not present

## 2015-03-19 DIAGNOSIS — K219 Gastro-esophageal reflux disease without esophagitis: Secondary | ICD-10-CM | POA: Diagnosis present

## 2015-03-19 DIAGNOSIS — Z7982 Long term (current) use of aspirin: Secondary | ICD-10-CM

## 2015-03-19 DIAGNOSIS — D649 Anemia, unspecified: Secondary | ICD-10-CM | POA: Diagnosis present

## 2015-03-19 HISTORY — PX: LAPAROSCOPIC NISSEN FUNDOPLICATION: SHX1932

## 2015-03-19 LAB — CBC
HEMATOCRIT: 44.6 % (ref 36.0–46.0)
HEMOGLOBIN: 14.9 g/dL (ref 12.0–15.0)
MCH: 32.1 pg (ref 26.0–34.0)
MCHC: 33.4 g/dL (ref 30.0–36.0)
MCV: 96.1 fL (ref 78.0–100.0)
Platelets: 196 10*3/uL (ref 150–400)
RBC: 4.64 MIL/uL (ref 3.87–5.11)
RDW: 12.9 % (ref 11.5–15.5)
WBC: 12.5 10*3/uL — ABNORMAL HIGH (ref 4.0–10.5)

## 2015-03-19 LAB — CREATININE, SERUM
CREATININE: 0.64 mg/dL (ref 0.44–1.00)
GFR calc Af Amer: >60 mL/min (ref >60)
GFR calc non Af Amer: >60 mL/min (ref >60)

## 2015-03-19 SURGERY — FUNDOPLICATION, NISSEN, LAPAROSCOPIC
Anesthesia: General

## 2015-03-19 MED ORDER — DIPHENHYDRAMINE HCL 50 MG/ML IJ SOLN: 12.5000 mg | Freq: Four times a day (QID) | INTRAMUSCULAR | Status: DC | PRN

## 2015-03-19 MED ORDER — PROMETHAZINE HCL 25 MG/ML IJ SOLN: 6.2500 mg | INTRAMUSCULAR | Status: DC | PRN

## 2015-03-19 MED ORDER — PROPOFOL 10 MG/ML IV BOLUS
INTRAVENOUS | Status: DC | PRN
  Administered 2015-03-19: 100 mg via INTRAVENOUS

## 2015-03-19 MED ORDER — SODIUM CHLORIDE 0.9 % IJ SOLN
INTRAMUSCULAR | Status: AC
  Filled 2015-03-19: qty 20

## 2015-03-19 MED ORDER — FENTANYL CITRATE (PF) 250 MCG/5ML IJ SOLN
INTRAMUSCULAR | Status: AC
  Filled 2015-03-19: qty 25

## 2015-03-19 MED ORDER — MIDAZOLAM HCL 2 MG/2ML IJ SOLN
INTRAMUSCULAR | Status: AC
  Filled 2015-03-19: qty 4

## 2015-03-19 MED ORDER — LACTATED RINGERS IV SOLN
INTRAVENOUS | Status: DC | PRN
  Administered 2015-03-19 (×2): via INTRAVENOUS

## 2015-03-19 MED ORDER — HEPARIN SODIUM (PORCINE) 5000 UNIT/ML IJ SOLN
5000.0000 [IU] | Freq: Three times a day (TID) | INTRAMUSCULAR | Status: DC
  Administered 2015-03-19 – 2015-03-22 (×7): 5000 [IU] via SUBCUTANEOUS
  Filled 2015-03-19 (×11): qty 1

## 2015-03-19 MED ORDER — FENTANYL CITRATE (PF) 100 MCG/2ML IJ SOLN
INTRAMUSCULAR | Status: AC
  Filled 2015-03-19: qty 2

## 2015-03-19 MED ORDER — ONDANSETRON HCL 4 MG/2ML IJ SOLN
INTRAMUSCULAR | Status: DC | PRN
  Administered 2015-03-19: 4 mg via INTRAVENOUS

## 2015-03-19 MED ORDER — SUGAMMADEX SODIUM 200 MG/2ML IV SOLN
INTRAVENOUS | Status: DC | PRN
  Administered 2015-03-19: 285 mg via INTRAVENOUS

## 2015-03-19 MED ORDER — HYDROCODONE-ACETAMINOPHEN 5-325 MG PO TABS
1.0000 | ORAL_TABLET | ORAL | Status: DC | PRN
  Administered 2015-03-20: 2 via ORAL
  Administered 2015-03-20: 1 via ORAL
  Filled 2015-03-19: qty 2
  Filled 2015-03-19: qty 1

## 2015-03-19 MED ORDER — ONDANSETRON HCL 4 MG/2ML IJ SOLN
INTRAMUSCULAR | Status: AC
  Filled 2015-03-19: qty 2

## 2015-03-19 MED ORDER — CHLORHEXIDINE GLUCONATE 4 % EX LIQD: 1.0000 "application " | Freq: Once | CUTANEOUS | Status: DC

## 2015-03-19 MED ORDER — LIDOCAINE HCL (CARDIAC) 20 MG/ML IV SOLN
INTRAVENOUS | Status: DC | PRN
  Administered 2015-03-19: 100 mg via INTRAVENOUS

## 2015-03-19 MED ORDER — FENTANYL CITRATE (PF) 100 MCG/2ML IJ SOLN
25.0000 ug | INTRAMUSCULAR | Status: DC | PRN
  Administered 2015-03-19 (×3): 50 ug via INTRAVENOUS

## 2015-03-19 MED ORDER — ROCURONIUM BROMIDE 100 MG/10ML IV SOLN
INTRAVENOUS | Status: AC
  Filled 2015-03-19: qty 1

## 2015-03-19 MED ORDER — LACTATED RINGERS IV SOLN
INTRAVENOUS | Status: DC | PRN
  Administered 2015-03-19: 1000 mL

## 2015-03-19 MED ORDER — BUPIVACAINE LIPOSOME 1.3 % IJ SUSP
20.0000 mL | Freq: Once | INTRAMUSCULAR | Status: AC
  Administered 2015-03-19: 20 mL
  Filled 2015-03-19: qty 20

## 2015-03-19 MED ORDER — HEPARIN SODIUM (PORCINE) 5000 UNIT/ML IJ SOLN
5000.0000 [IU] | Freq: Once | INTRAMUSCULAR | Status: AC
  Administered 2015-03-19: 5000 [IU] via SUBCUTANEOUS
  Filled 2015-03-19: qty 1

## 2015-03-19 MED ORDER — ROCURONIUM BROMIDE 100 MG/10ML IV SOLN
INTRAVENOUS | Status: DC | PRN
  Administered 2015-03-19: 40 mg via INTRAVENOUS

## 2015-03-19 MED ORDER — LIDOCAINE HCL (CARDIAC) 20 MG/ML IV SOLN
INTRAVENOUS | Status: AC
  Filled 2015-03-19: qty 5

## 2015-03-19 MED ORDER — 0.9 % SODIUM CHLORIDE (POUR BTL) OPTIME
TOPICAL | Status: DC | PRN
  Administered 2015-03-19: 1000 mL

## 2015-03-19 MED ORDER — PROPOFOL 10 MG/ML IV BOLUS
INTRAVENOUS | Status: AC
  Filled 2015-03-19: qty 20

## 2015-03-19 MED ORDER — SUGAMMADEX SODIUM 500 MG/5ML IV SOLN
INTRAVENOUS | Status: AC
  Filled 2015-03-19: qty 5

## 2015-03-19 MED ORDER — MORPHINE SULFATE (PF) 10 MG/ML IV SOLN
1.0000 mg | INTRAVENOUS | Status: DC | PRN
  Administered 2015-03-19 (×2): 1 mg via INTRAVENOUS
  Filled 2015-03-19: qty 1

## 2015-03-19 MED ORDER — CEFAZOLIN SODIUM-DEXTROSE 2-3 GM-% IV SOLR
2.0000 g | INTRAVENOUS | Status: AC
  Administered 2015-03-19: 2 g via INTRAVENOUS

## 2015-03-19 MED ORDER — DIPHENHYDRAMINE HCL 12.5 MG/5ML PO ELIX: 12.5000 mg | ORAL_SOLUTION | Freq: Four times a day (QID) | ORAL | Status: DC | PRN

## 2015-03-19 MED ORDER — MORPHINE SULFATE (PF) 10 MG/ML IV SOLN
INTRAVENOUS | Status: AC
  Filled 2015-03-19: qty 1

## 2015-03-19 MED ORDER — LABETALOL HCL 5 MG/ML IV SOLN
INTRAVENOUS | Status: AC
  Filled 2015-03-19: qty 4

## 2015-03-19 MED ORDER — DEXAMETHASONE SODIUM PHOSPHATE 10 MG/ML IJ SOLN
INTRAMUSCULAR | Status: DC | PRN
  Administered 2015-03-19: 10 mg via INTRAVENOUS

## 2015-03-19 MED ORDER — FENTANYL CITRATE (PF) 100 MCG/2ML IJ SOLN
INTRAMUSCULAR | Status: DC | PRN
  Administered 2015-03-19 (×5): 50 ug via INTRAVENOUS

## 2015-03-19 MED ORDER — CEFAZOLIN SODIUM-DEXTROSE 2-3 GM-% IV SOLR
INTRAVENOUS | Status: AC
  Filled 2015-03-19: qty 50

## 2015-03-19 MED ORDER — DEXAMETHASONE SODIUM PHOSPHATE 10 MG/ML IJ SOLN
INTRAMUSCULAR | Status: AC
  Filled 2015-03-19: qty 1

## 2015-03-19 MED ORDER — ONDANSETRON 4 MG PO TBDP
4.0000 mg | ORAL_TABLET | Freq: Four times a day (QID) | ORAL | Status: DC | PRN
  Administered 2015-03-21: 4 mg via ORAL
  Filled 2015-03-19: qty 1

## 2015-03-19 MED ORDER — ONDANSETRON HCL 4 MG/2ML IJ SOLN: 4.0000 mg | Freq: Four times a day (QID) | INTRAMUSCULAR | Status: DC | PRN

## 2015-03-19 MED ORDER — LABETALOL HCL 5 MG/ML IV SOLN
INTRAVENOUS | Status: DC | PRN
  Administered 2015-03-19 (×2): 2.5 mg via INTRAVENOUS

## 2015-03-19 MED ORDER — INFLUENZA VAC SPLIT QUAD 0.5 ML IM SUSY
0.5000 mL | PREFILLED_SYRINGE | INTRAMUSCULAR | Status: AC
  Administered 2015-03-22: 0.5 mL via INTRAMUSCULAR
  Filled 2015-03-19 (×3): qty 0.5

## 2015-03-19 MED ORDER — PANTOPRAZOLE SODIUM 40 MG IV SOLR
40.0000 mg | Freq: Every day | INTRAVENOUS | Status: DC
  Administered 2015-03-19: 40 mg via INTRAVENOUS
  Filled 2015-03-19 (×4): qty 40

## 2015-03-19 MED ORDER — KCL IN DEXTROSE-NACL 20-5-0.45 MEQ/L-%-% IV SOLN
INTRAVENOUS | Status: DC
  Administered 2015-03-19 – 2015-03-20 (×2): via INTRAVENOUS
  Administered 2015-03-20: 100 mL/h via INTRAVENOUS
  Filled 2015-03-19 (×3): qty 1000

## 2015-03-19 MED ORDER — SUCCINYLCHOLINE CHLORIDE 20 MG/ML IJ SOLN
INTRAMUSCULAR | Status: DC | PRN
  Administered 2015-03-19: 100 mg via INTRAVENOUS

## 2015-03-19 MED ORDER — MIDAZOLAM HCL 5 MG/5ML IJ SOLN
INTRAMUSCULAR | Status: DC | PRN
  Administered 2015-03-19: 1 mg via INTRAVENOUS

## 2015-03-19 SURGICAL SUPPLY — 43 items
APPLIER CLIP ROT 10 11.4 M/L (STAPLE)
CABLE HIGH FREQUENCY MONO STRZ (ELECTRODE) ×3 IMPLANT
CLAMP ENDO BABCK 10MM (STAPLE) IMPLANT
CLIP APPLIE ROT 10 11.4 M/L (STAPLE) IMPLANT
COVER SURGICAL LIGHT HANDLE (MISCELLANEOUS) ×3 IMPLANT
DECANTER SPIKE VIAL GLASS SM (MISCELLANEOUS) ×3 IMPLANT
DEVICE SUT QUICK LOAD TK 5 (STAPLE) ×18 IMPLANT
DEVICE SUT TI-KNOT TK 5X26 (MISCELLANEOUS) ×2 IMPLANT
DEVICE SUTURE ENDOST 10MM (ENDOMECHANICALS) ×3 IMPLANT
DEVICE TI KNOT TK5 (MISCELLANEOUS) ×1
DISSECTOR BLUNT TIP ENDO 5MM (MISCELLANEOUS) ×3 IMPLANT
DRAIN PENROSE 18X1/2 LTX STRL (DRAIN) ×3 IMPLANT
DRAPE LAPAROSCOPIC ABDOMINAL (DRAPES) ×3 IMPLANT
ELECT REM PT RETURN 9FT ADLT (ELECTROSURGICAL) ×3
ELECTRODE REM PT RTRN 9FT ADLT (ELECTROSURGICAL) ×1 IMPLANT
GLOVE BIOGEL M 8.0 STRL (GLOVE) ×3 IMPLANT
GOWN STRL REUS W/TWL XL LVL3 (GOWN DISPOSABLE) ×9 IMPLANT
GRASPER ENDO BABCOCK 10 (MISCELLANEOUS) IMPLANT
GRASPER ENDO BABCOCK 10MM (MISCELLANEOUS)
KIT BASIN OR (CUSTOM PROCEDURE TRAY) ×3 IMPLANT
LIQUID BAND (GAUZE/BANDAGES/DRESSINGS) ×3 IMPLANT
QUICK LOAD TK 5 (STAPLE) ×9
SCISSORS LAP 5X45 EPIX DISP (ENDOMECHANICALS) ×3 IMPLANT
SET IRRIG TUBING LAPAROSCOPIC (IRRIGATION / IRRIGATOR) ×3 IMPLANT
SHEARS HARMONIC ACE PLUS 45CM (MISCELLANEOUS) ×3 IMPLANT
SLEEVE ADV FIXATION 12X100MM (TROCAR) ×3 IMPLANT
SLEEVE ADV FIXATION 5X100MM (TROCAR) ×6 IMPLANT
STAPLER VISISTAT 35W (STAPLE) ×3 IMPLANT
SUT SURGIDAC NAB ES-9 0 48 120 (SUTURE) ×27 IMPLANT
SUT VIC AB 4-0 SH 18 (SUTURE) ×3 IMPLANT
TIP INNERVISION DETACH 40FR (MISCELLANEOUS) IMPLANT
TIP INNERVISION DETACH 50FR (MISCELLANEOUS) IMPLANT
TIP INNERVISION DETACH 56FR (MISCELLANEOUS) ×3 IMPLANT
TIPS INNERVISION DETACH 40FR (MISCELLANEOUS)
TOWEL OR 17X26 10 PK STRL BLUE (TOWEL DISPOSABLE) ×6 IMPLANT
TOWEL OR NON WOVEN STRL DISP B (DISPOSABLE) IMPLANT
TRAY FOLEY W/METER SILVER 14FR (SET/KITS/TRAYS/PACK) IMPLANT
TRAY FOLEY W/METER SILVER 16FR (SET/KITS/TRAYS/PACK) IMPLANT
TRAY LAPAROSCOPIC (CUSTOM PROCEDURE TRAY) ×3 IMPLANT
TROCAR ADV FIXATION 12X100MM (TROCAR) ×3 IMPLANT
TROCAR ADV FIXATION 5X100MM (TROCAR) ×3 IMPLANT
TROCAR BLADELESS OPT 5 100 (ENDOMECHANICALS) ×3 IMPLANT
TUBING FILTER THERMOFLATOR (ELECTROSURGICAL) ×3 IMPLANT

## 2015-03-19 NOTE — H&P (View-Only) (Signed)
Lauren [name]  Location: [facility] Patient #: [id number] DOB: 08/16/1940 Single / Language: English / Race: White Female  History of Present Illness (Latronda Spink B. Andrey Mccaskill MD; 09/05/2014 11:07 AM) Patient words: CC. Hiatus hernia with anemia  HPI: This very pleasant 73-year-old lady with some history of cognitive delay is accompanied by her cousin, Nancy Young having been referred by Dr. Buccini for severe iron deficiency anemia attributed to a large hiatal hernia found on upper endoscopy. Dr. Buccini obtained an upper GI series and I reviewed that and this shows about a third of her stomach is above her diaphragm.  She has a history of gastroesophageal reflux. She does have a history of taking aspirin daily. This will endoscopy now was compared to 1 and 2002 at which time she had only a small hiatal hernia. She still has heartburn and takes Zantac to bedtime. She indicated to me that sometimes she does get choked on things particularly trying to eat rice.  I explained lap hiatus hernia repair including Nissen fundoplication with her in some detail. I gave her the booklet on this procedure. She would like to schedule this repair.  Informed consent obtained and orders placed in EPIC.        Past Surgical History  Appendectomy Colon Polyp Removal - Colonoscopy  Diagnostic Studies History Colonoscopy within last year Pap Smear 1-5 years ago  Allergies  No Known Drug Allergies04/28/2016  Medication History  Alendronate Sodium (35MG Tablet, Oral) Active. Lovastatin (20MG Tablet, Oral) Active. Atenolol (25MG Tablet, Oral) Active. ClonazePAM (0.5MG Tablet, Oral) Active. Ranitidine HCl (150MG Tablet, Oral) Active. Medications Reconciled  Social History  Caffeine use Coffee, Tea. Tobacco use Former smoker.  Pregnancy / Birth History  Age of menopause <47 Para 0  Review of Systems  Skin Not Present- Change in Wart/Mole, Dryness, Hives, Jaundice, New  Lesions, Non-Healing Wounds, Rash and Ulcer. Cardiovascular Present- Swelling of Extremities. Not Present- Chest Pain, Difficulty Breathing Lying Down, Leg Cramps, Palpitations, Rapid Heart Rate and Shortness of Breath. Gastrointestinal Present- Indigestion. Not Present- Abdominal Pain, Bloating, Bloody Stool, Change in Bowel Habits, Chronic diarrhea, Constipation, Difficulty Swallowing, Excessive gas, Gets full quickly at meals, Hemorrhoids, Nausea, Rectal Pain and Vomiting. Musculoskeletal Not Present- Back Pain, Joint Pain, Joint Stiffness, Muscle Pain, Muscle Weakness and Swelling of Extremities. Neurological Not Present- Decreased Memory, Fainting, Headaches, Numbness, Seizures, Tingling, Tremor, Trouble walking and Weakness.   Vitals ([name] CMA;eight: 145 lb Height: 57in Body Surface Area: 1.63 m Body Mass Index: 31.38 kg/m Temp.: 97.7F(Temporal)  Pulse: 76 (Regular)  BP: 114/72 (Sitting, Left Arm, Standard)    Physical Exam  The physical exam findings are as follows: Note:HEENT-unremarkable NECK-no thyromegaly or bruits CHEST-clear to auscultation HEART-SR withou murmurs ABDOMEN-non tender, no prior surgery EXT-FROM NEURO-alert and oriented x 3; able to follow my discussion and respond appropriately. Facial tic occassionally    Assessment & Plan (Kavitha Lansdale B. Dawna Jakes MD;  HIATUS HERNIA SYNDROME (553.3  K44.9)--Plan lap repair of hiatus hernia with Nissen fundoplication  

## 2015-03-19 NOTE — Anesthesia Procedure Notes (Signed)
Procedure Name: Intubation Date/Time: 03/19/2015 8:47 AM Performed by: Orest DikesPETERS, Laquita Harlan J Pre-anesthesia Checklist: Patient identified, Emergency Drugs available, Suction available and Patient being monitored Patient Re-evaluated:Patient Re-evaluated prior to inductionOxygen Delivery Method: Circle System Utilized Preoxygenation: Pre-oxygenation with 100% oxygen Intubation Type: IV induction, Rapid sequence and Cricoid Pressure applied Laryngoscope Size: Mac and 4 Grade View: Grade I Tube type: Oral Tube size: 7.0 mm Number of attempts: 1 Airway Equipment and Method: Stylet Placement Confirmation: ETT inserted through vocal cords under direct vision,  positive ETCO2 and breath sounds checked- equal and bilateral Secured at: 20 cm Tube secured with: Tape Dental Injury: Teeth and Oropharynx as per pre-operative assessment

## 2015-03-19 NOTE — Anesthesia Preprocedure Evaluation (Addendum)
Anesthesia Evaluation  Patient identified by MRN, date of birth, ID band Patient awake    Reviewed: Allergy & Precautions, NPO status , Patient's Chart, lab work & pertinent test results, reviewed documented beta blocker date and time   Airway Mallampati: II  TM Distance: >3 FB Neck ROM: Full    Dental  (+) Edentulous Upper, Edentulous Lower   Pulmonary neg pulmonary ROS,    breath sounds clear to auscultation- rhonchi       Cardiovascular hypertension, Pt. on medications and Pt. on home beta blockers  Rhythm:Regular Rate:Normal     Neuro/Psych negative neurological ROS     GI/Hepatic Neg liver ROS, hiatal hernia,   Endo/Other  negative endocrine ROS  Renal/GU negative Renal ROS     Musculoskeletal  (+) Arthritis ,   Abdominal   Peds  Hematology negative hematology ROS (+)   Anesthesia Other Findings   Reproductive/Obstetrics                            Lab Results  Component Value Date   WBC 6.6 03/12/2015   HGB 14.9 03/12/2015   HCT 44.7 03/12/2015   MCV 96.1 03/12/2015   PLT 208 03/12/2015   Lab Results  Component Value Date   CREATININE 0.61 03/12/2015   BUN 17 03/12/2015   NA 139 03/12/2015   K 5.3* 03/12/2015   CL 106 03/12/2015   CO2 27 03/12/2015    Anesthesia Physical Anesthesia Plan  ASA: II  Anesthesia Plan: General   Post-op Pain Management:    Induction: Intravenous  Airway Management Planned: Oral ETT  Additional Equipment:   Intra-op Plan:   Post-operative Plan: Extubation in OR  Informed Consent: I have reviewed the patients History and Physical, chart, labs and discussed the procedure including the risks, benefits and alternatives for the proposed anesthesia with the patient or authorized representative who has indicated his/her understanding and acceptance.   Dental advisory given  Plan Discussed with: CRNA  Anesthesia Plan Comments:          Anesthesia Quick Evaluation

## 2015-03-19 NOTE — Brief Op Note (Signed)
03/19/2015  10:44 AM  PATIENT:  Lauren RocherMyrtle [name]  74 y.o. female  PRE-OPERATIVE DIAGNOSIS:  HIATAL HERNIA  POST-OPERATIVE DIAGNOSIS:  HIATAL HERNIA  PROCEDURE:  Procedure(s): LAPAROSCOPIC NISSEN FUNDOPLICATION AND HIATAL HERNIA REPAIR (N/A)  SURGEON:  Surgeon(s) and Role:    * Luretha MurphyMatthew Konrad Hoak, MD - Primary    * Harriette Bouillonhomas Cornett, MD - Assisting  PHYSICIAN ASSISTANT:   ASSISTANTS: Harriette Bouillonhomas Cornett, MD, FACS   ANESTHESIA:   general  EBL:  Total I/O In: 1000 [I.V.:1000] Out: 300 [Urine:300]  BLOOD ADMINISTERED:none  DRAINS: none   LOCAL MEDICATIONS USED:  BUPIVICAINE   SPECIMEN:  No Specimen  DISPOSITION OF SPECIMEN:  N/A  COUNTS:  YES  TOURNIQUET:  * No tourniquets in log *  DICTATION: .Other Dictation: Dictation Number [id number]  PLAN OF CARE: Admit to inpatient   PATIENT DISPOSITION:  PACU - hemodynamically stable.   Delay start of Pharmacological VTE agent (>24hrs) due to surgical blood loss or risk of bleeding: no

## 2015-03-19 NOTE — Transfer of Care (Signed)
Immediate Anesthesia Transfer of Care Note  Patient: Lauren [name]  Procedure(s) Performed: Procedure(s): LAPAROSCOPIC NISSEN FUNDOPLICATION AND HIATAL HERNIA REPAIR (N/A)  Patient Location: PACU  Anesthesia Type:General  Level of Consciousness:  sedated, patient cooperative and responds to stimulation  Airway & Oxygen Therapy:Patient Spontanous Breathing and Patient connected to face mask oxgen  Post-op Assessment:  Report given to PACU RN and Post -op Vital signs reviewed and stable  Post vital signs:  Reviewed and stable  Last Vitals:  Filed Vitals:   03/19/15 0612  BP: 160/90  Pulse: 71  Temp: 36.4 C  Resp: 16    Complications: No apparent anesthesia complications

## 2015-03-19 NOTE — Interval H&P Note (Signed)
History and Physical Interval Note:  03/19/2015 8:28 AM  Lauren [name]  has presented today for surgery, with the diagnosis of HIATAL HERNIA  The various methods of treatment have been discussed with the patient and family. After consideration of risks, benefits and other options for treatment, the patient has consented to  Procedure(s): LAPAROSCOPIC NISSEN FUNDOPLICATION AND HIATAL HERNIA REPAIR (N/A) as a surgical intervention .  The patient's history has been reviewed, patient examined, no change in status, stable for surgery.  I have reviewed the patient's chart and labs.  Questions were answered to the patient's satisfaction.     Dagmawi Venable B

## 2015-03-19 NOTE — Anesthesia Postprocedure Evaluation (Signed)
  Anesthesia Post-op Note  Patient: Lauren [name]  Procedure(s) Performed: Procedure(s): LAPAROSCOPIC NISSEN FUNDOPLICATION AND HIATAL HERNIA REPAIR (N/A)  Patient Location: PACU  Anesthesia Type:General  Level of Consciousness: awake and alert   Airway and Oxygen Therapy: Patient Spontanous Breathing  Post-op Pain: none  Post-op Assessment: Post-op Vital signs reviewed              Post-op Vital Signs: Reviewed  Last Vitals:  Filed Vitals:   03/19/15 1300  BP: 172/80  Pulse: 64  Temp:   Resp: 16    Complications: No apparent anesthesia complications

## 2015-03-20 ENCOUNTER — Inpatient Hospital Stay (HOSPITAL_COMMUNITY): Payer: Medicare Other

## 2015-03-20 LAB — CBC
HEMATOCRIT: 39.4 % (ref 36.0–46.0)
Hemoglobin: 13.3 g/dL (ref 12.0–15.0)
MCH: 32 pg (ref 26.0–34.0)
MCHC: 33.8 g/dL (ref 30.0–36.0)
MCV: 94.7 fL (ref 78.0–100.0)
PLATELETS: 192 10*3/uL (ref 150–400)
RBC: 4.16 MIL/uL (ref 3.87–5.11)
RDW: 12.8 % (ref 11.5–15.5)
WBC: 9.7 10*3/uL (ref 4.0–10.5)

## 2015-03-20 LAB — BASIC METABOLIC PANEL
ANION GAP: 8 (ref 5–15)
BUN: 10 mg/dL (ref 6–20)
CALCIUM: 8.5 mg/dL — AB (ref 8.9–10.3)
CO2: 21 mmol/L — AB (ref 22–32)
CREATININE: 0.56 mg/dL (ref 0.44–1.00)
Chloride: 108 mmol/L (ref 101–111)
GFR calc Af Amer: >60 mL/min (ref >60)
GLUCOSE: 188 mg/dL — AB (ref 65–99)
Potassium: 4.6 mmol/L (ref 3.5–5.1)
Sodium: 137 mmol/L (ref 135–145)

## 2015-03-20 MED ORDER — IOHEXOL 300 MG/ML  SOLN
150.0000 mL | Freq: Once | INTRAMUSCULAR | Status: DC | PRN
  Administered 2015-03-20: 50 mL via ORAL
  Filled 2015-03-20: qty 150

## 2015-03-20 NOTE — Op Note (Signed)
NAMBiagio Borg:  [name], Lauren               ACCOUNT NO.:  [id number]  MEDICAL RECORD NO.:  [id number]  LOCATION:  1522                         [facility]  PHYSICIAN:  Thornton ParkMatthew B. Daphine DeutscherMartin, MD  DATE OF BIRTH:  [birth year]  DATE OF PROCEDURE:  03/19/2015 DATE OF DISCHARGE:                              OPERATIVE REPORT   PREOPERATIVE DIAGNOSIS:  Large type 3 mixed hiatal hernia with anemia.  PROCEDURE:  Laparoscopic takedown of type 3 mixed hiatal hernia, repair of the diaphragm with multiple Surgidac sutures without pledgets and Nissen fundoplication over a 56 lighted bougie.  SURGEON:  Thornton ParkMatthew B. Daphine DeutscherMartin, MD.  ASSISTANT:  Clovis Puhomas A. Cornett, M.D.  ANESTHESIA:  General endotracheal.  DESCRIPTION OF PROCEDURE:  The patient was taken to room [facility], given general anesthesia.  The abdomen was prepped with PCMX and draped sterilely.  I entered the abdomen through the left upper quadrant with a 5 mm Optiview without difficulty.  Following insufflation, we surveyed the abdomen, put her in a head-up position.  Second and a third 5 were placed on the left side.  One for the camera, two for the assistant, and a 12 and 5 were placed on the right and then the upper midline 5 for the [facility] retractor.  The liver was retracted.  We were able to then get the stomach reduced into the abdomen and I took down the peritoneal reflection along the right crus and then anteriorly.  The left crus was identified and then the sac taken down from that side.  Once the sac was down in the abdomen along with the stomach, I excised large portions of the sac.  When this had been done, I was able to put a Penrose drain around the EG junction.  I completed the dissection completely around that enabling me to see the crura and closed it with multiple Surgidac sutures probably at least 6, secured with tie knots.  This included an anterior suture as well.  When this was completed, the 56 bougie which was in place  was left there in place and using a shoeshine maneuver, identified portion of wrapped stomach and then secured that to the esophagogastric junction with 3 sutures of Surgidac again with the Endo stitch and secured with tie knots.  Everything looked to be in order.  The patient seemed to tolerate the procedure well.  The ports were injected with Exparel and closed with 4-0 Vicryl and LiquiBand. The patient tolerated the procedure well and was taken to recovery room in satisfactory condition.     Thornton ParkMatthew B. Daphine DeutscherMartin, MD     MBM/MEDQ  D:  03/19/2015  T:  03/20/2015  Job:  [id number]  cc:   Bernette Redbirdobert Buccini, M.D. Fax: [phone]

## 2015-03-20 NOTE — Progress Notes (Signed)
Patient ID: Lauren [name], female   DOB: [birth year], 74 y.o.   MRN: [id number] [facility] Surgery Progress Note:   1 Day Post-Op  Subjective: Mental status is clear.  Seen in Xray Objective: Vital signs in last 24 hours: Temp:  [98.1 F (36.7 C)-100.3 F (37.9 C)] 98.1 F (36.7 C) (11/10 0922) Pulse Rate:  [63-79] 63 (11/10 0922) Resp:  [14-18] 18 (11/10 0922) BP: (139-160)/(57-78) 154/57 mmHg (11/10 0922) SpO2:  [98 %-100 %] 99 % (11/10 0922)  Intake/Output from previous day: 11/09 0701 - 11/10 0700 In: 1560 [I.V.:1560] Out: 2900 [Urine:2900] Intake/Output this shift: Total I/O In: 0  Out: 150 [Urine:150]  Physical Exam: Work of breathing is not elevated.  Incision is OK  Lab Results:  Results for orders placed or performed during the hospital encounter of 03/19/15 (from the past 48 hour(s))  CBC     Status: Abnormal   Collection Time: 03/19/15 12:50 PM  Result Value Ref Range   WBC 12.5 (H) 4.0 - 10.5 K/uL   RBC 4.64 3.87 - 5.11 MIL/uL   Hemoglobin 14.9 12.0 - 15.0 g/dL   HCT 44.6 36.0 - 46.0 %   MCV 96.1 78.0 - 100.0 fL   MCH 32.1 26.0 - 34.0 pg   MCHC 33.4 30.0 - 36.0 g/dL   RDW 12.9 11.5 - 15.5 %   Platelets 196 150 - 400 K/uL  Creatinine, serum     Status: None   Collection Time: 03/19/15 12:50 PM  Result Value Ref Range   Creatinine, Ser 0.64 0.44 - 1.00 mg/dL   GFR calc non Af Amer >60 >60 mL/min   GFR calc Af Amer >60 >60 mL/min    Comment: (NOTE) The eGFR has been calculated using the CKD EPI equation. This calculation has not been validated in all clinical situations. eGFR's persistently <60 mL/min signify possible Chronic Kidney Disease.   CBC     Status: None   Collection Time: 03/20/15  5:43 AM  Result Value Ref Range   WBC 9.7 4.0 - 10.5 K/uL   RBC 4.16 3.87 - 5.11 MIL/uL   Hemoglobin 13.3 12.0 - 15.0 g/dL   HCT 39.4 36.0 - 46.0 %   MCV 94.7 78.0 - 100.0 fL   MCH 32.0 26.0 - 34.0 pg   MCHC 33.8 30.0 - 36.0 g/dL   RDW 12.8 11.5 - 15.5 %    Platelets 192 150 - 400 K/uL  Basic metabolic panel     Status: Abnormal   Collection Time: 03/20/15  5:43 AM  Result Value Ref Range   Sodium 137 135 - 145 mmol/L   Potassium 4.6 3.5 - 5.1 mmol/L   Chloride 108 101 - 111 mmol/L   CO2 21 (L) 22 - 32 mmol/L   Glucose, Bld 188 (H) 65 - 99 mg/dL   BUN 10 6 - 20 mg/dL   Creatinine, Ser 0.56 0.44 - 1.00 mg/dL   Calcium 8.5 (L) 8.9 - 10.3 mg/dL   GFR calc non Af Amer >60 >60 mL/min   GFR calc Af Amer >60 >60 mL/min    Comment: (NOTE) The eGFR has been calculated using the CKD EPI equation. This calculation has not been validated in all clinical situations. eGFR's persistently <60 mL/min signify possible Chronic Kidney Disease.    Anion gap 8 5 - 15    Radiology/Results: Dg Ugi W/water Sol Cm  03/20/2015  CLINICAL DATA:  Status post hiatal hernia repair EXAM: UPPER GI SERIES WITH KUB TECHNIQUE: After obtaining  a scout radiograph a routine upper GI series was performed using 50 cc omni 300 FLUOROSCOPY TIME:  Radiation Exposure Index (as provided by the fluoroscopic device): If the device does not provide the exposure index: Fluoroscopy Time (in minutes and seconds):  54 seconds Number of Acquired Images:  None COMPARISON:  07/31/2014. FINDINGS: Scout radiograph shows air-filled loops of small and large bowel which have a normal caliber. Postoperative changes compatible with fundoplication wrap identified. There are no complications identified. No extravasation of contrast material identified to suggest leakage. Passage of contrast material through the fundoplication wrap into the stomach and proximal small bowel loops noted. IMPRESSION: 1. Status post fundoplication wrap.  No complications. Electronically Signed   By: Kerby Moors M.D.   On: 03/20/2015 09:54    Anti-infectives: Anti-infectives    Start     Dose/Rate Route Frequency Ordered Stop   03/19/15 0614  ceFAZolin (ANCEF) IVPB 2 g/50 mL premix     2 g 100 mL/hr over 30 Minutes  Intravenous On call to O.R. 03/19/15 0614 03/19/15 0852      Assessment/Plan: Problem List: Patient Active Problem List   Diagnosis Date Noted  . Status post Nissen fundoplication 51/76/1607  . Abdominal pain   . Microcytic anemia 04/02/2014  . Essential hypertension, benign 04/02/2014  . Hyperlipidemia 04/02/2014  . B12 deficiency 04/02/2014  . Anemia 04/01/2014    Swallow OK.  Advance to clear liquids 1 Day Post-Op    LOS: 1 day   Matt B. Hassell Done, MD, [facility] [phone] beeper [phone]  03/20/2015 2:08 PM

## 2015-03-20 NOTE — Progress Notes (Signed)
IV site infiltrated.  IV removed without diff., site unremarkable.  IV team attempted without success to restart IV.  Telephone call to Dr. Ermalene SearingMartin's office, spoke with Toniann FailWendy, informed of patient's current status.  Per Toniann FailWendy, Dr. Daphine DeutscherMartin states to order clear liquid diet on patient.  Orders entered as instructed.

## 2015-03-20 NOTE — Care Management Note (Signed)
Case Management Note  Patient Details  Name: Lauren [name] MRN: [id number] Date of Birth: 05/02/1941  Subjective/Objective:       S/p Nissen fundoplication              Action/Plan: Discharge planning, no new needs identified  Expected Discharge Date:                  Expected Discharge Plan:  Home/Self Care  In-House Referral:  NA  Discharge planning Services  CM Consult  Post Acute Care Choice:  NA Choice offered to:  NA  DME Arranged:  N/A DME Agency:  NA  HH Arranged:  NA HH Agency:  NA  Status of Service:  Completed, signed off  Medicare Important Message Given:    Date Medicare IM Given:    Medicare IM give by:    Date Additional Medicare IM Given:    Additional Medicare Important Message give by:     If discussed at Long Length of Stay Meetings, dates discussed:    Additional Comments:  Alexis Goodelleele, Makenlee Mckeag K, RN 03/20/2015, 11:46 AM

## 2015-03-21 LAB — BASIC METABOLIC PANEL
Anion gap: 6 (ref 5–15)
BUN: 12 mg/dL (ref 6–20)
CO2: 27 mmol/L (ref 22–32)
CREATININE: 0.59 mg/dL (ref 0.44–1.00)
Calcium: 8.9 mg/dL (ref 8.9–10.3)
Chloride: 102 mmol/L (ref 101–111)
GFR calc Af Amer: >60 mL/min (ref >60)
Glucose, Bld: 117 mg/dL — ABNORMAL HIGH (ref 65–99)
Potassium: 4.1 mmol/L (ref 3.5–5.1)
SODIUM: 135 mmol/L (ref 135–145)

## 2015-03-21 LAB — CBC
HCT: 42.6 % (ref 36.0–46.0)
Hemoglobin: 14.2 g/dL (ref 12.0–15.0)
MCH: 32.5 pg (ref 26.0–34.0)
MCHC: 33.3 g/dL (ref 30.0–36.0)
MCV: 97.5 fL (ref 78.0–100.0)
PLATELETS: 173 10*3/uL (ref 150–400)
RBC: 4.37 MIL/uL (ref 3.87–5.11)
RDW: 13.1 % (ref 11.5–15.5)
WBC: 8.7 10*3/uL (ref 4.0–10.5)

## 2015-03-21 NOTE — Progress Notes (Signed)
Patient ID: Lauren [name], female   DOB: 31-Oct-1940, 74 y.o.   MRN: [id number] [facility] Surgery Progress Note:   2 Days Post-Op  Subjective: Mental status is clear.  No complaints.   Objective: Vital signs in last 24 hours: Temp:  [97.9 F (36.6 C)-99 F (37.2 C)] 97.9 F (36.6 C) (11/11 0500) Pulse Rate:  [63-87] 87 (11/11 0500) Resp:  [14-22] 17 (11/11 0500) BP: (138-167)/(57-86) 164/86 mmHg (11/11 0500) SpO2:  [95 %-100 %] 98 % (11/11 0500)  Intake/Output from previous day: 11/10 0701 - 11/11 0700 In: 0  Out: 575 [Urine:575] Intake/Output this shift:    Physical Exam: Work of breathing is not labored but audible ronchi noted.  Abdomen is not sore.    Lab Results:  Results for orders placed or performed during the hospital encounter of 03/19/15 (from the past 48 hour(s))  CBC     Status: Abnormal   Collection Time: 03/19/15 12:50 PM  Result Value Ref Range   WBC 12.5 (H) 4.0 - 10.5 K/uL   RBC 4.64 3.87 - 5.11 MIL/uL   Hemoglobin 14.9 12.0 - 15.0 g/dL   HCT 44.6 36.0 - 46.0 %   MCV 96.1 78.0 - 100.0 fL   MCH 32.1 26.0 - 34.0 pg   MCHC 33.4 30.0 - 36.0 g/dL   RDW 12.9 11.5 - 15.5 %   Platelets 196 150 - 400 K/uL  Creatinine, serum     Status: None   Collection Time: 03/19/15 12:50 PM  Result Value Ref Range   Creatinine, Ser 0.64 0.44 - 1.00 mg/dL   GFR calc non Af Amer >60 >60 mL/min   GFR calc Af Amer >60 >60 mL/min    Comment: (NOTE) The eGFR has been calculated using the CKD EPI equation. This calculation has not been validated in all clinical situations. eGFR's persistently <60 mL/min signify possible Chronic Kidney Disease.   CBC     Status: None   Collection Time: 03/20/15  5:43 AM  Result Value Ref Range   WBC 9.7 4.0 - 10.5 K/uL   RBC 4.16 3.87 - 5.11 MIL/uL   Hemoglobin 13.3 12.0 - 15.0 g/dL   HCT 39.4 36.0 - 46.0 %   MCV 94.7 78.0 - 100.0 fL   MCH 32.0 26.0 - 34.0 pg   MCHC 33.8 30.0 - 36.0 g/dL   RDW 12.8 11.5 - 15.5 %   Platelets 192 150  - 400 K/uL  Basic metabolic panel     Status: Abnormal   Collection Time: 03/20/15  5:43 AM  Result Value Ref Range   Sodium 137 135 - 145 mmol/L   Potassium 4.6 3.5 - 5.1 mmol/L   Chloride 108 101 - 111 mmol/L   CO2 21 (L) 22 - 32 mmol/L   Glucose, Bld 188 (H) 65 - 99 mg/dL   BUN 10 6 - 20 mg/dL   Creatinine, Ser 0.56 0.44 - 1.00 mg/dL   Calcium 8.5 (L) 8.9 - 10.3 mg/dL   GFR calc non Af Amer >60 >60 mL/min   GFR calc Af Amer >60 >60 mL/min    Comment: (NOTE) The eGFR has been calculated using the CKD EPI equation. This calculation has not been validated in all clinical situations. eGFR's persistently <60 mL/min signify possible Chronic Kidney Disease.    Anion gap 8 5 - 15  CBC     Status: None   Collection Time: 03/21/15  5:00 AM  Result Value Ref Range   WBC 8.7 4.0 -  10.5 K/uL   RBC 4.37 3.87 - 5.11 MIL/uL   Hemoglobin 14.2 12.0 - 15.0 g/dL   HCT 42.6 36.0 - 46.0 %   MCV 97.5 78.0 - 100.0 fL   MCH 32.5 26.0 - 34.0 pg   MCHC 33.3 30.0 - 36.0 g/dL   RDW 13.1 11.5 - 15.5 %   Platelets 173 150 - 400 K/uL  Basic metabolic panel     Status: Abnormal   Collection Time: 03/21/15  5:00 AM  Result Value Ref Range   Sodium 135 135 - 145 mmol/L   Potassium 4.1 3.5 - 5.1 mmol/L   Chloride 102 101 - 111 mmol/L   CO2 27 22 - 32 mmol/L   Glucose, Bld 117 (H) 65 - 99 mg/dL   BUN 12 6 - 20 mg/dL   Creatinine, Ser 0.59 0.44 - 1.00 mg/dL   Calcium 8.9 8.9 - 10.3 mg/dL   GFR calc non Af Amer >60 >60 mL/min   GFR calc Af Amer >60 >60 mL/min    Comment: (NOTE) The eGFR has been calculated using the CKD EPI equation. This calculation has not been validated in all clinical situations. eGFR's persistently <60 mL/min signify possible Chronic Kidney Disease.    Anion gap 6 5 - 15    Radiology/Results: Dg Ugi W/water Sol Cm  03/20/2015  CLINICAL DATA:  Status post hiatal hernia repair EXAM: UPPER GI SERIES WITH KUB TECHNIQUE: After obtaining a scout radiograph a routine upper GI  series was performed using 50 cc omni 300 FLUOROSCOPY TIME:  Radiation Exposure Index (as provided by the fluoroscopic device): If the device does not provide the exposure index: Fluoroscopy Time (in minutes and seconds):  54 seconds Number of Acquired Images:  None COMPARISON:  07/31/2014. FINDINGS: Scout radiograph shows air-filled loops of small and large bowel which have a normal caliber. Postoperative changes compatible with fundoplication wrap identified. There are no complications identified. No extravasation of contrast material identified to suggest leakage. Passage of contrast material through the fundoplication wrap into the stomach and proximal small bowel loops noted. IMPRESSION: 1. Status post fundoplication wrap.  No complications. Electronically Signed   By: Kerby Moors M.D.   On: 03/20/2015 09:54    Anti-infectives: Anti-infectives    Start     Dose/Rate Route Frequency Ordered Stop   03/19/15 0614  ceFAZolin (ANCEF) IVPB 2 g/50 mL premix     2 g 100 mL/hr over 30 Minutes Intravenous On call to O.R. 03/19/15 0614 03/19/15 0852      Assessment/Plan: Problem List: Patient Active Problem List   Diagnosis Date Noted  . Status post Nissen fundoplication 45/07/8880  . Abdominal pain   . Microcytic anemia 04/02/2014  . Essential hypertension, benign 04/02/2014  . Hyperlipidemia 04/02/2014  . B12 deficiency 04/02/2014  . Anemia 04/01/2014    Pulmonary toilet to be improved.  Pt lives alone.  Not ready for discharge today.  Will advance to full liquids.  Check CBC in AM.   2 Days Post-Op    LOS: 2 days   Matt B. Hassell Done, MD, [facility] [phone] beeper [phone]  03/21/2015 8:25 AM

## 2015-03-22 DIAGNOSIS — K449 Diaphragmatic hernia without obstruction or gangrene: Secondary | ICD-10-CM | POA: Diagnosis not present

## 2015-03-22 LAB — CBC WITH DIFFERENTIAL/PLATELET
Basophils Absolute: 0 10*3/uL (ref 0.0–0.1)
Basophils Relative: 0 %
Eosinophils Absolute: 0 10*3/uL (ref 0.0–0.7)
Eosinophils Relative: 0 %
HEMATOCRIT: 43 % (ref 36.0–46.0)
HEMOGLOBIN: 14.7 g/dL (ref 12.0–15.0)
LYMPHS ABS: 1 10*3/uL (ref 0.7–4.0)
LYMPHS PCT: 16 %
MCH: 32.5 pg (ref 26.0–34.0)
MCHC: 34.2 g/dL (ref 30.0–36.0)
MCV: 95.1 fL (ref 78.0–100.0)
MONOS PCT: 16 %
Monocytes Absolute: 1 10*3/uL (ref 0.1–1.0)
NEUTROS ABS: 4.1 10*3/uL (ref 1.7–7.7)
NEUTROS PCT: 68 %
Platelets: 167 10*3/uL (ref 150–400)
RBC: 4.52 MIL/uL (ref 3.87–5.11)
RDW: 12.8 % (ref 11.5–15.5)
WBC: 6 10*3/uL (ref 4.0–10.5)

## 2015-03-22 MED ORDER — HYDROCODONE-ACETAMINOPHEN 5-325 MG PO TABS: 1.0000 | ORAL_TABLET | ORAL | Status: AC | PRN

## 2015-03-22 MED ORDER — ONDANSETRON HCL 4 MG PO TABS: 4.0000 mg | ORAL_TABLET | Freq: Three times a day (TID) | ORAL | Status: AC | PRN

## 2015-03-22 NOTE — Discharge Instructions (Signed)
Advance diet from full liquids to pureed diet (consistency of baby food) for about 4 weeks before resuming regular food

## 2015-03-22 NOTE — Progress Notes (Signed)
Assessment unchanged. Pt and cousin(caregiver at home) verbalized understanding of dc instructions through teach back including follow-up care, pureed diet to resume at home and when to call doctor. Script x 1 given as provided by MD. Discharged via wc to front entrance to meet awaiting vehicle to carry home. Accompanied by cousin and NT.

## 2015-03-22 NOTE — Discharge Summary (Signed)
Physician Discharge Summary  Patient ID: Lauren RocherMyrtle Gadomski MRN: [id number] DOB/AGE: [birth year] 72 y.o.  Admit date: 03/19/2015 Discharge date: 03/22/2015  Admission Diagnoses:  Large type III mixed hiatal hernia  Discharge Diagnoses:  Same post repair and Nissen fundoplication  Active Problems:   Status post Nissen fundoplication   Surgery:  Laparoscopic Nissen fuindoplication  Discharged Condition: improved  Hospital Course:   Had surgery on Wednesday following Election Day 2016.  Swallow on PD 1 looked good.  Diet advanced and patient ready to go home on Saturday.3    Consults: none  Significant Diagnostic Studies: UGI looked good    Discharge Exam: Blood pressure 146/75, pulse 80, temperature 98.3 F (36.8 C), temperature source Oral, resp. rate 16, height 4' 8.5" (1.435 m), weight 71.442 kg (157 lb 8 oz), SpO2 94 %. Incisions OK  Disposition: 01-Home or Self Care  Discharge Instructions    Diet general    Complete by:  As directed   Pureed diet for 4 weeks then advance to regular diet     Discharge instructions    Complete by:  As directed   May shower ad lib     Increase activity slowly    Complete by:  As directed      No wound care    Complete by:  As directed             Medication List    TAKE these medications        acetaminophen 500 MG tablet  Commonly known as:  TYLENOL  Take 500-1,000 mg by mouth every 6 (six) hours as needed for moderate pain.     alendronate 35 MG tablet  Commonly known as:  FOSAMAX  Take 35 mg by mouth every 7 (seven) days. On Wednesday     aspirin 81 MG tablet  Take 81 mg by mouth daily.     atenolol 25 MG tablet  Commonly known as:  TENORMIN  Take 25 mg by mouth daily.     clonazePAM 0.5 MG tablet  Commonly known as:  KLONOPIN  Take 0.5-1 tablets (0.25-0.5 mg total) by mouth at bedtime as needed for anxiety.     ergocalciferol 50000 UNITS capsule  Commonly known as:  VITAMIN D2  Take 50,000 Units by mouth every 14  (fourteen) days.     ferrous sulfate 325 (65 FE) MG tablet  Take 325 mg by mouth daily with breakfast.     furosemide 40 MG tablet  Commonly known as:  LASIX  Take 40 mg by mouth daily as needed for fluid.     HYDROcodone-acetaminophen 5-325 MG tablet  Commonly known as:  NORCO/VICODIN  Take 1-2 tablets by mouth every 4 (four) hours as needed for moderate pain.     lovastatin 20 MG tablet  Commonly known as:  MEVACOR  Take 20 mg by mouth daily with supper.     ondansetron 4 MG tablet  Commonly known as:  ZOFRAN  Take 1 tablet (4 mg total) by mouth every 8 (eight) hours as needed for nausea.     ranitidine 150 MG capsule  Commonly known as:  ZANTAC  Take 150 mg by mouth every evening.     vitamin B-12 1000 MCG tablet  Commonly known as:  CYANOCOBALAMIN  Take 1,000 mcg by mouth daily.           Follow-up Information    Follow up with Luretha MurphyMARTIN,Zeth Buday B, MD In 3 weeks.   Specialty:  General Surgery  Contact information:   [address] [place] [address] [phone]       Signed: Valarie Merino 03/22/2015, 10:14 AM

## 2015-12-09 DEATH — deceased

## 2017-07-26 IMAGING — RF DG UGI W/ GASTROGRAFIN
8 series · 12 of 12 positions shown · non-contrast
Comparison: 07/31/2014.

CLINICAL DATA: Status post hiatal hernia repair

EXAM:
UPPER GI SERIES WITH KUB
TECHNIQUE: After obtaining a scout radiograph a routine upper GI series was
performed using 50 cc omni 300
FLUOROSCOPY TIME:  Radiation Exposure Index (as provided by the
fluoroscopic device):
If the device does not provide the exposure index:
Fluoroscopy Time (in minutes and seconds):  54 seconds
Number of Acquired Images:  None

[Series 1: t abdomen supine · 0.15mm/px · 1 of 1 slices shown]
[im 1/1]
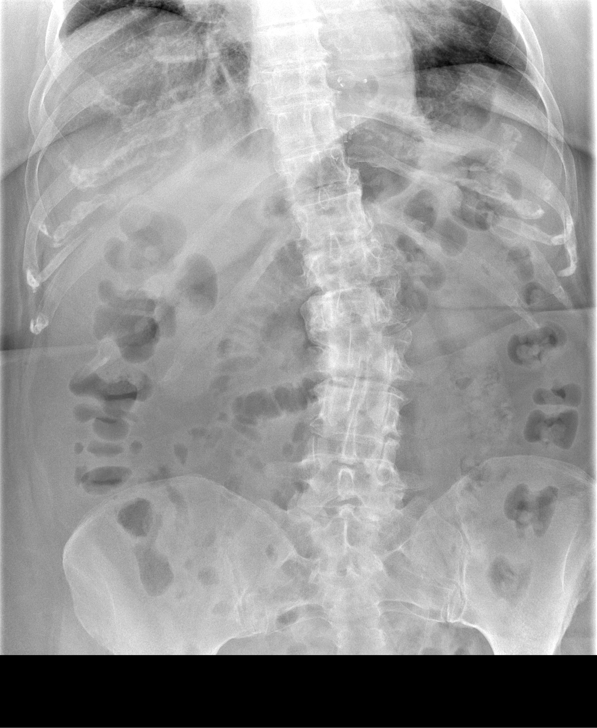

[Series 2: cp_standard · 0.35mm/px · 2 of 2 frames shown (1 of 7)]
[frame 1/2]
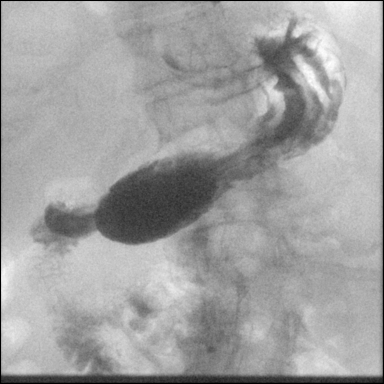
[frame 2/2]
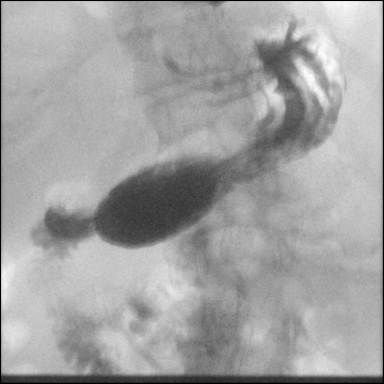

[Series 3: cp_standard · 0.17mm/px · 1 of 1 slices shown (2 of 7)]
[im 1/1]
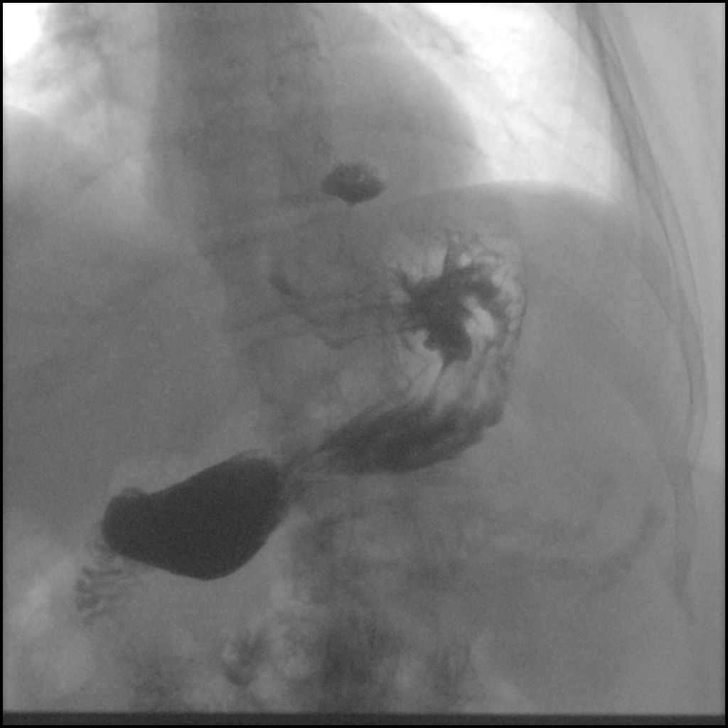

[Series 4: cp_standard · 0.35mm/px · 4 of 147 frames shown (3 of 7)]
[frame 23/147]
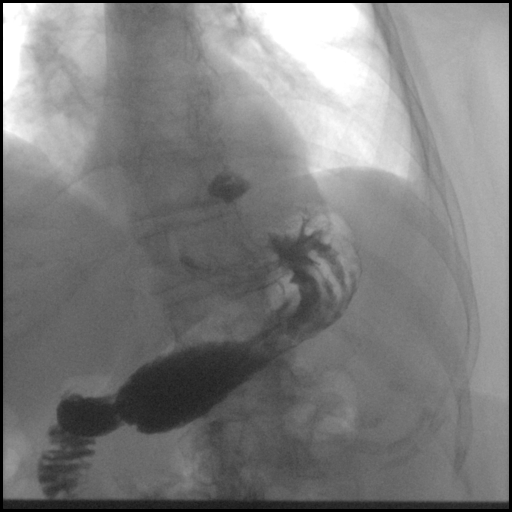
[frame 60/147]
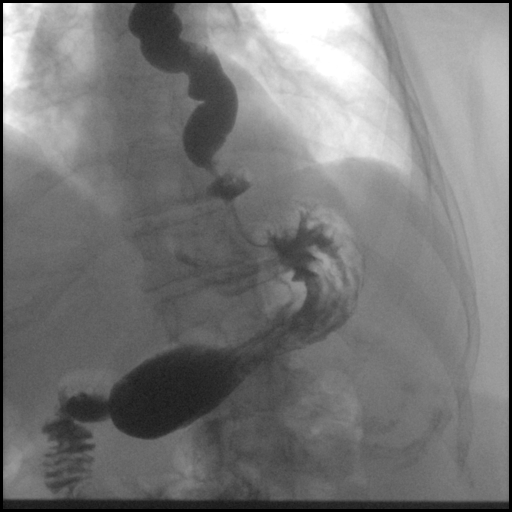
[frame 74/147]
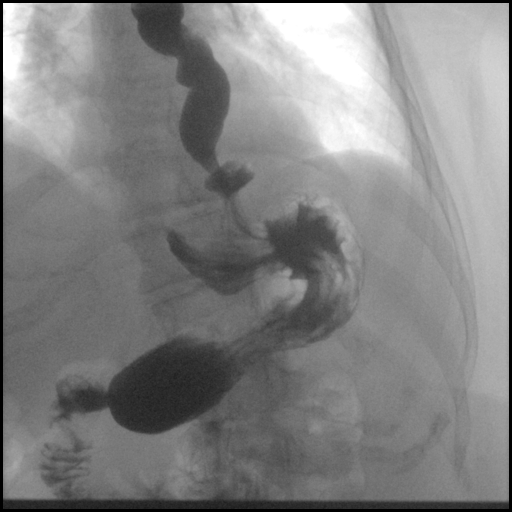
[frame 125/147]
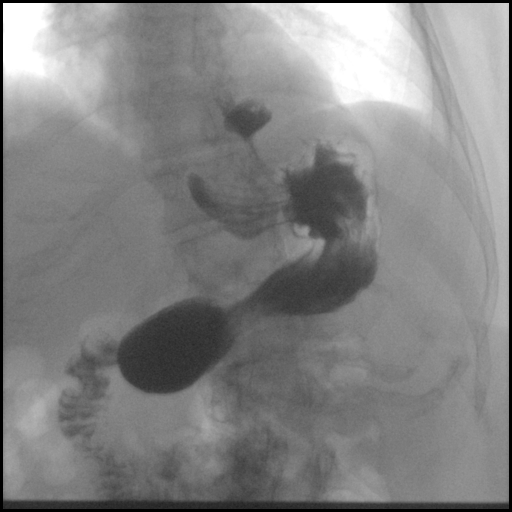

[Series 5: cp_standard · 0.17mm/px · 1 of 1 slices shown (4 of 7)]
[im 1/1]
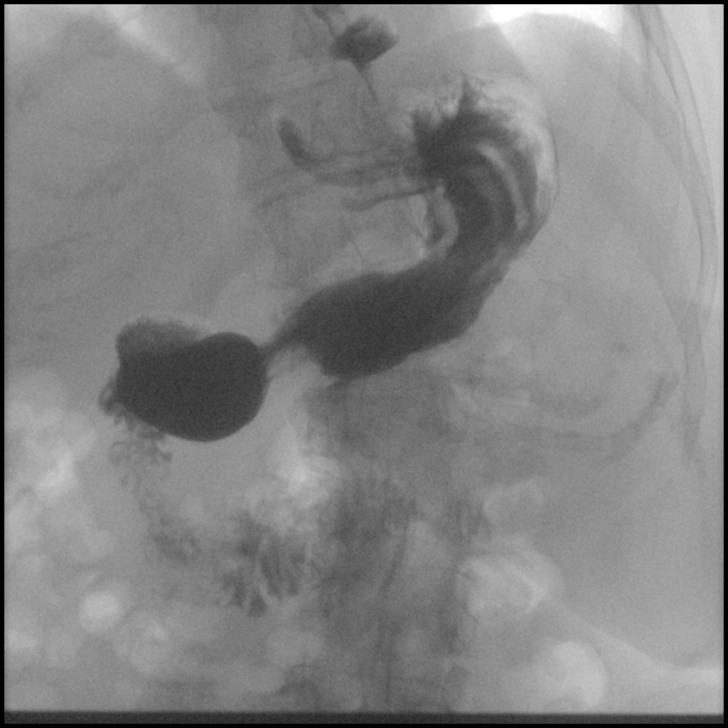

[Series 6: cp_standard · 0.17mm/px · 1 of 1 slices shown (5 of 7)]
[im 1/1]
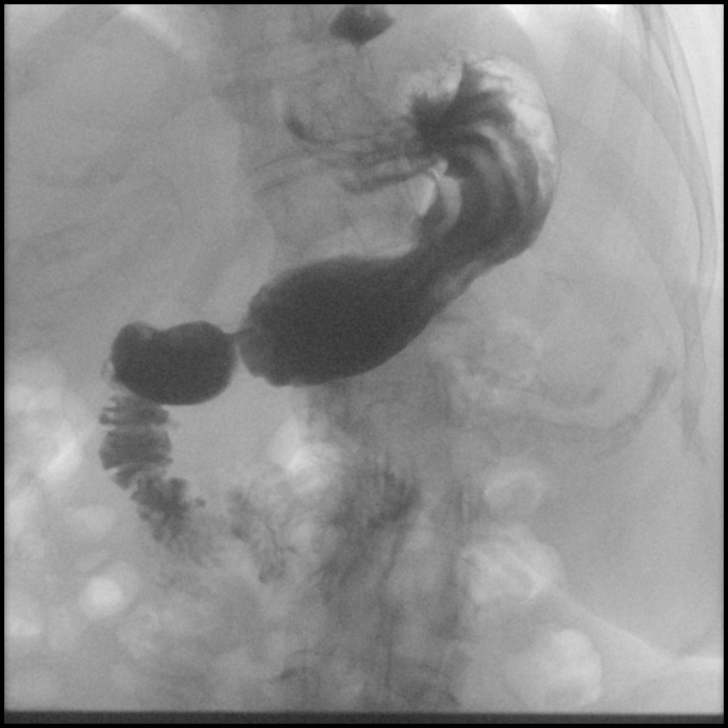

[Series 7: cp_standard · 0.09mm/px · 1 of 1 slices shown (6 of 7)]
[im 1/1]
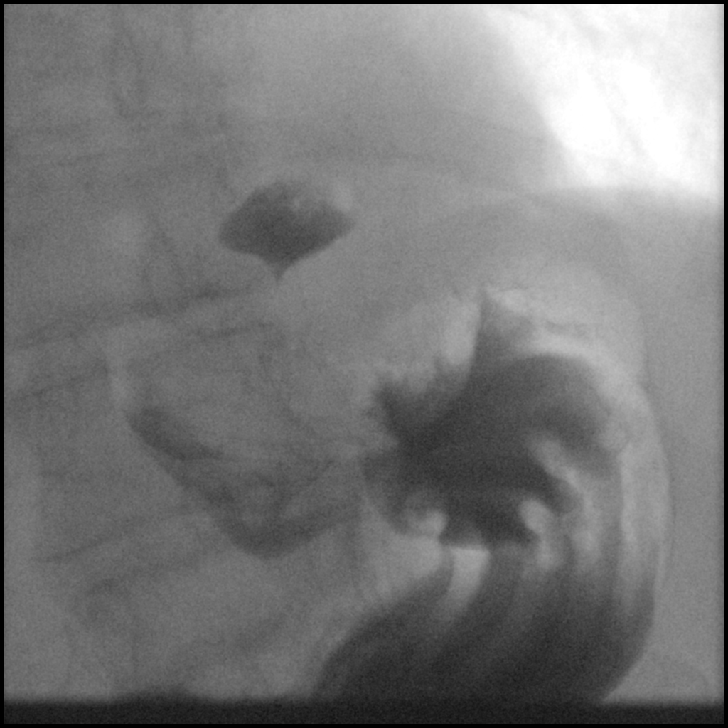

[Series 8: cp_standard · 0.09mm/px · 1 of 1 slices shown (7 of 7)]
[im 1/1]
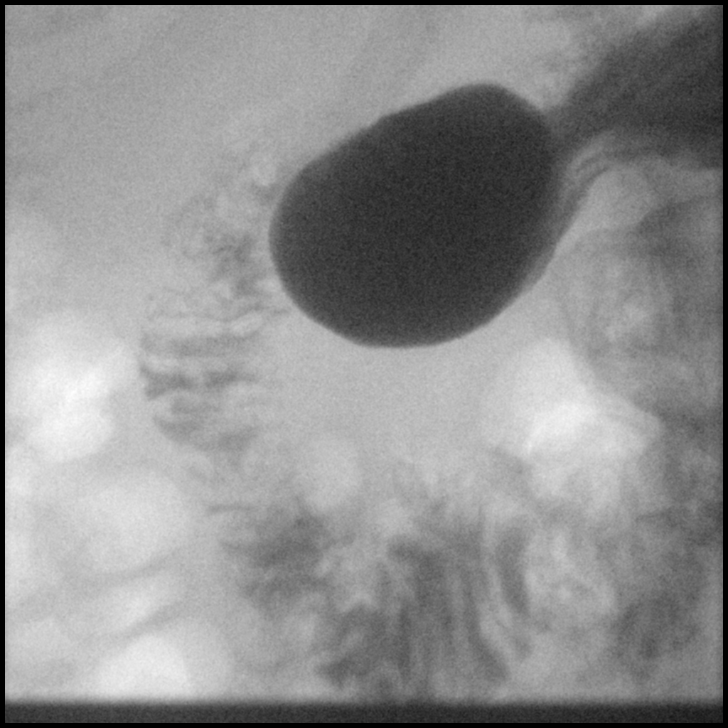

[12 of 12 positions shown; findings below may reference images not displayed]

FINDINGS: Scout radiograph shows air-filled loops of small and large bowel
which have a normal caliber. Postoperative changes compatible with
fundoplication wrap identified. There are no complications
identified. No extravasation of contrast material identified to
suggest leakage. Passage of contrast material through the
fundoplication wrap into the stomach and proximal small bowel loops
noted.
IMPRESSION: 1. Status post fundoplication wrap.  No complications.
